# Patient Record
Sex: Male | Born: 1970 | Race: Black or African American | Hispanic: No | Marital: Single | Smoking: Never smoker
Health system: Southern US, Community
[De-identification: ages and names within clinical notes are randomized; demographics above are authoritative.]

## PROBLEM LIST (undated history)

## (undated) DIAGNOSIS — I10 Essential (primary) hypertension: Secondary | ICD-10-CM

## (undated) DIAGNOSIS — F1010 Alcohol abuse, uncomplicated: Secondary | ICD-10-CM

## (undated) DIAGNOSIS — Z9119 Patient's noncompliance with other medical treatment and regimen: Secondary | ICD-10-CM

## (undated) DIAGNOSIS — Z91199 Patient's noncompliance with other medical treatment and regimen due to unspecified reason: Secondary | ICD-10-CM

## (undated) DIAGNOSIS — E119 Type 2 diabetes mellitus without complications: Secondary | ICD-10-CM

## (undated) DIAGNOSIS — K859 Acute pancreatitis without necrosis or infection, unspecified: Secondary | ICD-10-CM

---

## 2004-10-23 ENCOUNTER — Emergency Department (HOSPITAL_COMMUNITY): Admission: EM | Admit: 2004-10-23 | Discharge: 2004-10-23 | Payer: Self-pay | Admitting: Emergency Medicine

## 2004-10-25 ENCOUNTER — Emergency Department (HOSPITAL_COMMUNITY): Admission: EM | Admit: 2004-10-25 | Discharge: 2004-10-25 | Payer: Self-pay | Admitting: Emergency Medicine

## 2005-02-25 ENCOUNTER — Emergency Department (HOSPITAL_COMMUNITY): Admission: EM | Admit: 2005-02-25 | Discharge: 2005-02-25 | Payer: Self-pay | Admitting: Emergency Medicine

## 2005-12-23 DIAGNOSIS — K859 Acute pancreatitis without necrosis or infection, unspecified: Secondary | ICD-10-CM

## 2005-12-23 HISTORY — DX: Acute pancreatitis without necrosis or infection, unspecified: K85.90

## 2007-07-03 ENCOUNTER — Emergency Department (HOSPITAL_COMMUNITY): Admission: EM | Admit: 2007-07-03 | Discharge: 2007-07-03 | Payer: Self-pay | Admitting: Emergency Medicine

## 2007-07-15 ENCOUNTER — Emergency Department (HOSPITAL_COMMUNITY): Admission: EM | Admit: 2007-07-15 | Discharge: 2007-07-15 | Payer: Self-pay | Admitting: Emergency Medicine

## 2007-10-06 ENCOUNTER — Emergency Department (HOSPITAL_COMMUNITY): Admission: EM | Admit: 2007-10-06 | Discharge: 2007-10-06 | Payer: Self-pay | Admitting: Emergency Medicine

## 2008-01-28 ENCOUNTER — Emergency Department (HOSPITAL_COMMUNITY): Admission: EM | Admit: 2008-01-28 | Discharge: 2008-01-28 | Payer: Self-pay | Admitting: Emergency Medicine

## 2008-06-13 ENCOUNTER — Emergency Department (HOSPITAL_COMMUNITY): Admission: EM | Admit: 2008-06-13 | Discharge: 2008-06-13 | Payer: Self-pay | Admitting: Emergency Medicine

## 2008-07-26 ENCOUNTER — Emergency Department (HOSPITAL_COMMUNITY): Admission: EM | Admit: 2008-07-26 | Discharge: 2008-07-26 | Payer: Self-pay | Admitting: Emergency Medicine

## 2008-10-03 ENCOUNTER — Emergency Department (HOSPITAL_COMMUNITY): Admission: EM | Admit: 2008-10-03 | Discharge: 2008-10-03 | Payer: Self-pay | Admitting: *Deleted

## 2011-10-07 LAB — RAPID STREP SCREEN (MED CTR MEBANE ONLY): Streptococcus, Group A Screen (Direct): POSITIVE — AB

## 2011-10-08 LAB — RAPID STREP SCREEN (MED CTR MEBANE ONLY): Streptococcus, Group A Screen (Direct): POSITIVE — AB

## 2013-12-19 ENCOUNTER — Emergency Department (HOSPITAL_COMMUNITY)
Admission: EM | Admit: 2013-12-19 | Discharge: 2013-12-19 | Disposition: A | Discharge status: Home / Self Care | Payer: Managed Care, Other (non HMO) | Attending: Emergency Medicine | Admitting: Emergency Medicine

## 2013-12-19 ENCOUNTER — Encounter (HOSPITAL_COMMUNITY): Payer: Self-pay | Admitting: Emergency Medicine

## 2013-12-19 DIAGNOSIS — B349 Viral infection, unspecified: Secondary | ICD-10-CM

## 2013-12-19 DIAGNOSIS — J069 Acute upper respiratory infection, unspecified: Secondary | ICD-10-CM | POA: Insufficient documentation

## 2013-12-19 DIAGNOSIS — IMO0001 Reserved for inherently not codable concepts without codable children: Secondary | ICD-10-CM | POA: Insufficient documentation

## 2013-12-19 DIAGNOSIS — B9789 Other viral agents as the cause of diseases classified elsewhere: Secondary | ICD-10-CM | POA: Insufficient documentation

## 2013-12-19 MED ORDER — IBUPROFEN 800 MG PO TABS: 800.0000 mg | ORAL_TABLET | Freq: Three times a day (TID) | ORAL | Status: DC

## 2013-12-19 MED ORDER — IBUPROFEN 800 MG PO TABS
800.0000 mg | ORAL_TABLET | Freq: Once | ORAL | Status: AC
  Administered 2013-12-19: 800 mg via ORAL
  Filled 2013-12-19: qty 1

## 2013-12-19 MED ORDER — OSELTAMIVIR PHOSPHATE 75 MG PO CAPS: 75.0000 mg | ORAL_CAPSULE | Freq: Two times a day (BID) | ORAL | Status: DC

## 2013-12-19 NOTE — ED Notes (Signed)
Patient with no complaints at this time. Respirations even and unlabored. Skin warm/dry. Discharge instructions reviewed with patient at this time. Patient given opportunity to voice concerns/ask questions. Patient discharged at this time and left Emergency Department with steady gait.   

## 2013-12-19 NOTE — ED Notes (Signed)
Cough, cold symptoms, and running a fever per pt.

## 2013-12-20 NOTE — ED Provider Notes (Signed)
CSN: [id number]     Arrival date & time 12/19/13  2118 History   First MD Initiated Contact with Patient 12/19/13 2233     Chief Complaint  Patient presents with  . Fever  . URI   (Consider location/radiation/quality/duration/timing/severity/associated sxs/prior Treatment) Patient is a 42 y.o. male presenting with fever and URI. The history is provided by the patient.  Fever Max temp prior to arrival:  102 Temp source:  Subjective Severity:  Moderate Onset quality:  Sudden Duration:  1 day Timing:  Constant Progression:  Worsening Chronicity:  New Relieved by:  Nothing Worsened by:  Nothing tried Ineffective treatments: OTC medications. Associated symptoms: chills, congestion, cough, myalgias, rhinorrhea and sore throat   Associated symptoms: no chest pain, no confusion, no diarrhea, no dysuria, no ear pain, no headaches, no nausea, no rash and no vomiting   Congestion:    Location:  Nasal   Interferes with sleep: no     Interferes with eating/drinking: no   Cough:    Cough characteristics:  Non-productive and productive   Sputum characteristics:  Yellow   Severity:  Mild   Onset quality:  Sudden   Duration:  2 days   Timing:  Intermittent   Progression:  Worsening   Chronicity:  New Myalgias:    Location:  Generalized   Quality:  Aching   Severity:  Moderate   Onset quality:  Sudden   Timing:  Constant Risk factors: sick contacts   URI Presenting symptoms: congestion, cough, fever, rhinorrhea and sore throat   Presenting symptoms: no ear pain   Associated symptoms: myalgias   Associated symptoms: no arthralgias, no headaches, no neck pain and no wheezing     History reviewed. No pertinent past medical history. History reviewed. No pertinent past surgical history. No family history on file. History  Substance Use Topics  . Smoking status: Never Smoker   . Smokeless tobacco: Not on file  . Alcohol Use: Yes    Review of Systems  Constitutional: Positive  for fever and chills. Negative for activity change and appetite change.  HENT: Positive for congestion, rhinorrhea and sore throat. Negative for ear pain, facial swelling and trouble swallowing.   Eyes: Negative for visual disturbance.  Respiratory: Positive for cough. Negative for chest tightness, shortness of breath, wheezing and stridor.   Cardiovascular: Negative for chest pain.  Gastrointestinal: Negative for nausea, vomiting, abdominal pain and diarrhea.  Genitourinary: Negative for dysuria, frequency, flank pain and decreased urine volume.  Musculoskeletal: Positive for myalgias. Negative for arthralgias, neck pain and neck stiffness.  Skin: Negative.  Negative for rash.  Neurological: Negative for dizziness, syncope, weakness, numbness and headaches.  Hematological: Negative for adenopathy.  Psychiatric/Behavioral: Negative for confusion.  All other systems reviewed and are negative.    Allergies  Review of patient's allergies indicates no known allergies.  Home Medications   Current Outpatient Rx  Name  Route  Sig  Dispense  Refill  . Pheniramine-PE-APAP (THERAFLU COLD & SORE THROAT) 20-10-325 MG PACK   Oral   Take 1 packet by mouth as needed (for cold symptoms).         Marland Kitchen ibuprofen (ADVIL,MOTRIN) 800 MG tablet   Oral   Take 1 tablet (800 mg total) by mouth 3 (three) times daily.   21 tablet   0   . oseltamivir (TAMIFLU) 75 MG capsule   Oral   Take 1 capsule (75 mg total) by mouth 2 (two) times daily. For 5 days   10 capsule  0    BP 156/100  Pulse 108  Temp(Src) 99.8 F (37.7 C) (Oral)  Resp 20  Ht 5\' 8"  (1.727 m)  Wt 216 lb (97.977 kg)  BMI 32.85 kg/m2  SpO2 98% Physical Exam  Nursing note and vitals reviewed. Constitutional: He is oriented to person, place, and time. He appears well-developed and well-nourished. No distress.  HENT:  Head: Normocephalic and atraumatic.  Right Ear: Tympanic membrane and ear canal normal.  Left Ear: Tympanic membrane  and ear canal normal.  Nose: Mucosal edema and rhinorrhea present.  Mouth/Throat: Uvula is midline and mucous membranes are normal. No trismus in the jaw. No uvula swelling. Posterior oropharyngeal erythema present. No oropharyngeal exudate, posterior oropharyngeal edema or tonsillar abscesses.  Eyes: Conjunctivae are normal.  Neck: Normal range of motion and phonation normal. Neck supple. No Brudzinski's sign and no Kernig's sign noted.  Cardiovascular: Normal rate, regular rhythm, normal heart sounds and intact distal pulses.   No murmur heard. Pulmonary/Chest: Effort normal and breath sounds normal. No respiratory distress. He has no wheezes. He has no rales.  Abdominal: Soft. He exhibits no distension. There is no tenderness. There is no rebound and no guarding.  Musculoskeletal: Normal range of motion. He exhibits no edema.  Lymphadenopathy:    He has no cervical adenopathy.  Neurological: He is alert and oriented to person, place, and time. He exhibits normal muscle tone. Coordination normal.  Skin: Skin is warm and dry.    ED Course  Procedures (including critical care time) Labs Review Labs Reviewed - No data to display Imaging Review No results found.  EKG Interpretation   None       MDM   1. Viral illness     Patient is well appearing.  VSS.  Sx's likely related to influenza.  Agrees to Tamiflu, ibuprofen and close f/u if not improving.  Pt appears stable for discharge.       Charlissa Petros L. Trisha Mangle, PA-C 12/20/13 0034

## 2013-12-22 NOTE — ED Provider Notes (Signed)
Medical screening examination/treatment/procedure(s) were performed by non-physician practitioner and as supervising physician I was immediately available for consultation/collaboration.  EKG Interpretation   None         Mollye Guinta L Fisher Hargadon, MD 12/22/13 1454 

## 2014-03-07 ENCOUNTER — Encounter (HOSPITAL_COMMUNITY): Payer: Self-pay | Admitting: Emergency Medicine

## 2014-03-07 ENCOUNTER — Emergency Department (HOSPITAL_COMMUNITY)
Admission: EM | Admit: 2014-03-07 | Discharge: 2014-03-07 | Disposition: A | Discharge status: Home / Self Care | Payer: Managed Care, Other (non HMO) | Attending: Emergency Medicine | Admitting: Emergency Medicine

## 2014-03-07 DIAGNOSIS — Y929 Unspecified place or not applicable: Secondary | ICD-10-CM | POA: Insufficient documentation

## 2014-03-07 DIAGNOSIS — M545 Low back pain, unspecified: Secondary | ICD-10-CM

## 2014-03-07 DIAGNOSIS — X500XXA Overexertion from strenuous movement or load, initial encounter: Secondary | ICD-10-CM | POA: Insufficient documentation

## 2014-03-07 DIAGNOSIS — IMO0002 Reserved for concepts with insufficient information to code with codable children: Secondary | ICD-10-CM | POA: Insufficient documentation

## 2014-03-07 DIAGNOSIS — Y9389 Activity, other specified: Secondary | ICD-10-CM | POA: Insufficient documentation

## 2014-03-07 MED ORDER — DIAZEPAM 5 MG PO TABS: 5.0000 mg | ORAL_TABLET | Freq: Every evening | ORAL | Status: DC | PRN

## 2014-03-07 MED ORDER — KETOROLAC TROMETHAMINE 60 MG/2ML IM SOLN
60.0000 mg | Freq: Once | INTRAMUSCULAR | Status: AC
  Administered 2014-03-07: 60 mg via INTRAMUSCULAR
  Filled 2014-03-07: qty 2

## 2014-03-07 MED ORDER — HYDROCODONE-ACETAMINOPHEN 5-325 MG PO TABS: 2.0000 | ORAL_TABLET | Freq: Four times a day (QID) | ORAL | Status: DC | PRN

## 2014-03-07 NOTE — ED Provider Notes (Signed)
Medical screening examination/treatment/procedure(s) were performed by non-physician practitioner and as supervising physician I was immediately available for consultation/collaboration.   EKG Interpretation None       Jony Ladnier, MD 03/07/14 2022 

## 2014-03-07 NOTE — ED Provider Notes (Signed)
CSN: [id number]     Arrival date & time 03/07/14  1503 History   First MD Initiated Contact with Patient 03/07/14 1742     Chief Complaint  Patient presents with  . Back Pain     (Consider location/radiation/quality/duration/timing/severity/associated sxs/prior Treatment) Patient is a 42 y.o. male presenting with back pain. The history is provided by the patient. No language interpreter was used.  Back Pain Location:  Lumbar spine Radiates to:  Does not radiate Pain severity:  Moderate Duration:  1 day Context: lifting heavy objects   Associated symptoms: no dysuria, no fever and no weakness    patient is a 43 year old male who presents here with back pain. He reports that he was moving heavy furniture yesterday and woke up this morning with lumbar back pain. He denies numbness or tingling. He denies incontinence of bowel or bladder. He reports full range of motion to all extremities. He reports that his back is increasingly more sore over the last 24 hours.    History reviewed. No pertinent past medical history. History reviewed. No pertinent past surgical history. History reviewed. No pertinent family history. History  Substance Use Topics  . Smoking status: Never Smoker   . Smokeless tobacco: Not on file  . Alcohol Use: Yes    Review of Systems  Constitutional: Negative for fever, chills and fatigue.  Genitourinary: Negative for dysuria.  Musculoskeletal: Positive for back pain. Negative for gait problem, neck pain and neck stiffness.  Skin: Negative for rash.  Neurological: Negative for weakness.  All other systems reviewed and are negative.      Allergies  Review of patient's allergies indicates no known allergies.  Home Medications   Current Outpatient Rx  Name  Route  Sig  Dispense  Refill  . ibuprofen (ADVIL,MOTRIN) 200 MG tablet   Oral   Take 200 mg by mouth every 6 (six) hours as needed.          BP 186/78  Pulse 78  Temp(Src) 98.1 F (36.7 C)  (Oral)  Resp 16  SpO2 100% Physical Exam  Nursing note and vitals reviewed. Constitutional: He is oriented to person, place, and time. He appears well-developed and well-nourished. No distress.  Well-appearing  HENT:  Head: Normocephalic and atraumatic.  Eyes: Conjunctivae and EOM are normal.  Neck: Normal range of motion. Neck supple. No JVD present. No tracheal deviation present. No thyromegaly present.  Cardiovascular: Normal rate, regular rhythm, normal heart sounds and intact distal pulses.   Pulmonary/Chest: Effort normal and breath sounds normal. No respiratory distress. He has no wheezes.  Musculoskeletal: Normal range of motion.  Right and left paravertebral tenderness in the lumbar region. No midline spinal tenderness, deformities or step offs. No numbness or tingling. No gait disturbances. Full range of motion x 4 extremities and back.   Lymphadenopathy:    He has no cervical adenopathy.  Neurological: He is alert and oriented to person, place, and time. No cranial nerve deficit. Coordination normal.  Skin: Skin is warm and dry. No rash noted.     Psychiatric: He has a normal mood and affect. His behavior is normal. Judgment and thought content normal.    ED Course  Procedures (including critical care time) Labs Review Labs Reviewed - No data to display Imaging Review No results found.   EKG Interpretation None      MDM   Final diagnoses:  Lumbar pain    Probable lumbosacral strain after moving heavy furniture yesterday. Patient awakened today with muscle  tension and pain in his lumbar region. No midline spinal tenderness, deformities or step offs. No focal weakness or gait deficits. No numbness or tingling. Good range of motion, strength and coordination. Patient reports some relief here with Toradol injection. Prescriptions for Valium and hydrocodone given. Discussed plan of care and patient agrees.       Irish EldersKelly Nadia Torr, NP 03/07/14 1859

## 2014-03-07 NOTE — ED Notes (Signed)
Pt reports that he developed lower back pain as a result of moving furniture yesterday.

## 2014-03-07 NOTE — ED Notes (Signed)
Low back pain after moving furniture yesterday

## 2014-03-07 NOTE — Discharge Instructions (Signed)
Back Exercises Back exercises help treat and prevent back injuries. The goal of back exercises is to increase the strength of your abdominal and back muscles and the flexibility of your back. These exercises should be started when you no longer have back pain. Back exercises include:  Pelvic Tilt. Lie on your back with your knees bent. Tilt your pelvis until the lower part of your back is against the floor. Hold this position 5 to 10 sec and repeat 5 to 10 times.  Knee to Chest. Pull first 1 knee up against your chest and hold for 20 to 30 seconds, repeat this with the other knee, and then both knees. This may be done with the other leg straight or bent, whichever feels better.  Sit-Ups or Curl-Ups. Bend your knees 90 degrees. Start with tilting your pelvis, and do a partial, slow sit-up, lifting your trunk only 30 to 45 degrees off the floor. Take at least 2 to 3 seconds for each sit-up. Do not do sit-ups with your knees out straight. If partial sit-ups are difficult, simply do the above but with only tightening your abdominal muscles and holding it as directed.  Hip-Lift. Lie on your back with your knees flexed 90 degrees. Push down with your feet and shoulders as you raise your hips a couple inches off the floor; hold for 10 seconds, repeat 5 to 10 times.  Back arches. Lie on your stomach, propping yourself up on bent elbows. Slowly press on your hands, causing an arch in your low back. Repeat 3 to 5 times. Any initial stiffness and discomfort should lessen with repetition over time.  Shoulder-Lifts. Lie face down with arms beside your body. Keep hips and torso pressed to floor as you slowly lift your head and shoulders off the floor. Do not overdo your exercises, especially in the beginning. Exercises may cause you some mild back discomfort which lasts for a few minutes; however, if the pain is more severe, or lasts for more than 15 minutes, do not continue exercises until you see your caregiver.  Improvement with exercise therapy for back problems is slow.  See your caregivers for assistance with developing a proper back exercise program. Document Released: 01/16/2005 Document Revised: 03/02/2012 Document Reviewed: 10/10/2011 [facility] Patient Information 2014 [place]. Lumbosacral Strain Lumbosacral strain is a strain of any of the parts that make up your lumbosacral vertebrae. Your lumbosacral vertebrae are the bones that make up the lower third of your backbone. Your lumbosacral vertebrae are held together by muscles and tough, fibrous tissue (ligaments).  CAUSES  A sudden blow to your back can cause lumbosacral strain. Also, anything that causes an excessive stretch of the muscles in the low back can cause this strain. This is typically seen when people exert themselves strenuously, fall, lift heavy objects, bend, or crouch repeatedly. RISK FACTORS  Physically demanding work.  Participation in pushing or pulling sports or sports that require sudden twist of the back (tennis, golf, baseball).  Weight lifting.  Excessive lower back curvature.  Forward-tilted pelvis.  Weak back or abdominal muscles or both.  Tight hamstrings. SIGNS AND SYMPTOMS  Lumbosacral strain may cause pain in the area of your injury or pain that moves (radiates) down your leg.  DIAGNOSIS Your health care provider can often diagnose lumbosacral strain through a physical exam. In some cases, you may need tests such as X-ray exams.  TREATMENT  Treatment for your lower back injury depends on many factors that your clinician will have to  evaluate. However, most treatment will include the use of anti-inflammatory medicines. HOME CARE INSTRUCTIONS   Avoid hard physical activities (tennis, racquetball, waterskiing) if you are not in proper physical condition for it. This may aggravate or create problems.  If you have a back problem, avoid sports requiring sudden body movements. Swimming and walking are  generally safer activities.  Maintain good posture.  Maintain a healthy weight.  For acute conditions, you may put ice on the injured area.  Put ice in a plastic bag.  Place a towel between your skin and the bag.  Leave the ice on for 20 minutes, 2 3 times a day.  When the low back starts healing, stretching and strengthening exercises may be recommended. SEEK MEDICAL CARE IF:  Your back pain is getting worse.  You experience severe back pain not relieved with medicines. SEEK IMMEDIATE MEDICAL CARE IF:   You have numbness, tingling, weakness, or problems with the use of your arms or legs.  There is a change in bowel or bladder control.  You have increasing pain in any area of the body, including your belly (abdomen).  You notice shortness of breath, dizziness, or feel faint.  You feel sick to your stomach (nauseous), are throwing up (vomiting), or become sweaty.  You notice discoloration of your toes or legs, or your feet get very cold. MAKE SURE YOU:   Understand these instructions.  Will watch your condition.  Will get help right away if you are not doing well or get worse. Document Released: 09/18/2005 Document Revised: 09/29/2013 Document Reviewed: 07/28/2013 [facility] Patient Information 2014 [place], [place].   Take a Valium at nighttime for muscle spasm Take hydrocodone for moderate to severe pain as prescribed Take ibuprofen during the daytime Use ice packs or heat for comfort

## 2015-06-26 ENCOUNTER — Encounter (HOSPITAL_COMMUNITY): Payer: Self-pay | Admitting: Emergency Medicine

## 2015-06-26 ENCOUNTER — Emergency Department (HOSPITAL_COMMUNITY)
Admission: EM | Admit: 2015-06-26 | Discharge: 2015-06-26 | Disposition: A | Discharge status: Home / Self Care | Payer: Managed Care, Other (non HMO) | Attending: Emergency Medicine | Admitting: Emergency Medicine

## 2015-06-26 DIAGNOSIS — Z79899 Other long term (current) drug therapy: Secondary | ICD-10-CM | POA: Diagnosis not present

## 2015-06-26 DIAGNOSIS — R112 Nausea with vomiting, unspecified: Secondary | ICD-10-CM | POA: Insufficient documentation

## 2015-06-26 DIAGNOSIS — R109 Unspecified abdominal pain: Secondary | ICD-10-CM | POA: Insufficient documentation

## 2015-06-26 LAB — COMPREHENSIVE METABOLIC PANEL
ALT: 35 U/L (ref 17–63)
AST: 32 U/L (ref 15–41)
Albumin: 4.4 g/dL (ref 3.5–5.0)
Alkaline Phosphatase: 48 U/L (ref 38–126)
Anion gap: 11 (ref 5–15)
BILIRUBIN TOTAL: 1.2 mg/dL (ref 0.3–1.2)
BUN: 12 mg/dL (ref 6–20)
CALCIUM: 8.8 mg/dL — AB (ref 8.9–10.3)
CO2: 27 mmol/L (ref 22–32)
CREATININE: 0.96 mg/dL (ref 0.61–1.24)
Chloride: 98 mmol/L — ABNORMAL LOW (ref 101–111)
GFR calc non Af Amer: >60 mL/min (ref >60)
Glucose, Bld: 125 mg/dL — ABNORMAL HIGH (ref 65–99)
POTASSIUM: 3.7 mmol/L (ref 3.5–5.1)
Sodium: 136 mmol/L (ref 135–145)
Total Protein: 7.4 g/dL (ref 6.5–8.1)

## 2015-06-26 LAB — URINE MICROSCOPIC-ADD ON

## 2015-06-26 LAB — URINALYSIS, ROUTINE W REFLEX MICROSCOPIC
BILIRUBIN URINE: NEGATIVE
Glucose, UA: NEGATIVE mg/dL
Ketones, ur: NEGATIVE mg/dL
Leukocytes, UA: NEGATIVE
NITRITE: NEGATIVE
PH: 6 (ref 5.0–8.0)
Protein, ur: NEGATIVE mg/dL
Specific Gravity, Urine: 1.015 (ref 1.005–1.030)
Urobilinogen, UA: 0.2 mg/dL (ref 0.0–1.0)

## 2015-06-26 LAB — CBC WITH DIFFERENTIAL/PLATELET
BASOS ABS: 0 10*3/uL (ref 0.0–0.1)
BASOS PCT: 0 % (ref 0–1)
EOS ABS: 0 10*3/uL (ref 0.0–0.7)
Eosinophils Relative: 0 % (ref 0–5)
HEMATOCRIT: 42.7 % (ref 39.0–52.0)
HEMOGLOBIN: 15 g/dL (ref 13.0–17.0)
LYMPHS ABS: 1.2 10*3/uL (ref 0.7–4.0)
LYMPHS PCT: 14 % (ref 12–46)
MCH: 31.9 pg (ref 26.0–34.0)
MCHC: 35.1 g/dL (ref 30.0–36.0)
MCV: 90.9 fL (ref 78.0–100.0)
MONO ABS: 0.5 10*3/uL (ref 0.1–1.0)
Monocytes Relative: 6 % (ref 3–12)
NEUTROS ABS: 7.1 10*3/uL (ref 1.7–7.7)
Neutrophils Relative %: 80 % — ABNORMAL HIGH (ref 43–77)
PLATELETS: 232 10*3/uL (ref 150–400)
RBC: 4.7 MIL/uL (ref 4.22–5.81)
RDW: 12.6 % (ref 11.5–15.5)
WBC: 8.9 10*3/uL (ref 4.0–10.5)

## 2015-06-26 LAB — LIPASE, BLOOD: LIPASE: 37 U/L (ref 22–51)

## 2015-06-26 MED ORDER — ONDANSETRON 8 MG PO TBDP
8.0000 mg | ORAL_TABLET | Freq: Once | ORAL | Status: AC
  Administered 2015-06-26: 8 mg via ORAL
  Filled 2015-06-26: qty 1

## 2015-06-26 MED ORDER — ONDANSETRON HCL 4 MG PO TABS: 4.0000 mg | ORAL_TABLET | Freq: Three times a day (TID) | ORAL | Status: DC | PRN

## 2015-06-26 MED ORDER — DICYCLOMINE HCL 10 MG/ML IM SOLN
20.0000 mg | Freq: Once | INTRAMUSCULAR | Status: AC
  Administered 2015-06-26: 20 mg via INTRAMUSCULAR
  Filled 2015-06-26: qty 2

## 2015-06-26 MED ORDER — DICYCLOMINE HCL 20 MG PO TABS: 20.0000 mg | ORAL_TABLET | Freq: Four times a day (QID) | ORAL | Status: DC | PRN

## 2015-06-26 NOTE — ED Notes (Signed)
Pt reports emesis yesterday with upper abd pain. Pt states he had some emesis earlier today, still has abd pain.

## 2015-06-26 NOTE — ED Notes (Signed)
Patient with no complaints at this time. Respirations even and unlabored. Skin warm/dry. Discharge instructions reviewed with patient at this time. Patient given opportunity to voice concerns/ask questions. Patient discharged at this time and left Emergency Department with steady gait.   

## 2015-06-26 NOTE — Discharge Instructions (Signed)
°Emergency Department Resource Guide °1) Find a Doctor and Pay Out of Pocket °Although you won't have to find out who is covered by your insurance plan, it is a good idea to ask around and get recommendations. You will then need to call the office and see if the doctor you have chosen will accept you as a new patient and what types of options they offer for patients who are self-pay. Some doctors offer discounts or will set up payment plans for their patients who do not have insurance, but you will need to ask so you aren't surprised when you get to your appointment. ° °2) Contact Your Local Health Department °Not all health departments have doctors that can see patients for sick visits, but many do, so it is worth a call to see if yours does. If you don't know where your local health department is, you can check in your phone book. The CDC also has a tool to help you locate your state's health department, and many state websites also have listings of all of their local health departments. ° °3) Find a Walk-in Clinic °If your illness is not likely to be very severe or complicated, you may want to try a walk in clinic. These are popping up all over the country in pharmacies, drugstores, and shopping centers. They're usually staffed by nurse practitioners or physician assistants that have been trained to treat common illnesses and complaints. They're usually fairly quick and inexpensive. However, if you have serious medical issues or chronic medical problems, these are probably not your best option. ° °No Primary Care Doctor: °- Call [facility] at  832-8000 - they can help you locate a primary care doctor that  accepts your insurance, provides certain services, etc. °- Physician Referral Service- 1-800-533-3463 ° °Chronic Pain Problems: °Organization         Address  Phone   Notes  °[facility]  (336) [phone] Patients need to be referred by their primary care doctor.  ° °Medication  Assistance: °Organization         Address  Phone   Notes  °[place] [facility] [address] °[place], [place] [address] (336) 641-8030 --Must be a resident of [place] °-- Must have NO insurance coverage whatsoever (no Medicaid/ Medicare, etc.) °-- The pt. MUST have a primary care doctor that directs their care regularly and follows them in the community °  °[facility]  (866) 331-1348   °[employer]  (888) 892-1162   ° °Agencies that provide inexpensive medical care: °Organization         Address  Phone   Notes  °[facility]  (336) 832-8035   °[facility]    (336) 832-7272   °[facility] [address] °[place], [place] [address] (336) 832-4777   °[facility] [address], °[place] (336) 271-4999   °[facility]    (336) 373-0678   °[facility]    (336) 272-1050   °[facility] ° [address], [place] Phone:  (336) 832-4444, Fax:  (336) 832-4440 Hours of Operation:  9 am - 6 pm, M-F.  Also accepts Medicaid/Medicare and self-pay.  °[facility] ° [address], [place] Phone: (336) [phone], Fax: (336) [phone]. Hours of Operation:  8:30 am - 5:30 pm, M-F.  Also accepts Medicaid and self-pay.  °[facility] [place] [address], [place] Phone: (336) 878-6027   °[facility] [address], [place], [place] (336)723-1848, Ext. 123 Mondays & Thursdays: 7-9 AM.  First 15 patients are seen on a first come, first serve basis. °  ° °Medicaid-accepting [place] Providers: ° °Organization         Address  Phone   Notes  °[facility] [address], [place] (336) 641-2100 Also accepts self-pay patients.  °[facility] [address], [place] ° (336) 856-9996   °[facility] [address], [place]  (336) 288-8857   °[facility] [address] [place] [address], [place] (336) 299-7000   °Veita Bland [address], [place]  ° (336) 373-1557 Only accepts [place] Access Medicaid patients after they have their name applied to their card.  ° °Self-Pay (no insurance) in [place]: ° °Organization         Address  Phone   Notes  °Sickle Cell Patients, [facility] [address], [place] (336) 832-1970   °[facility] [address], [place] (336) 832-4400   °[facility] ° [address], [place] (336) 992-4800   °[facility]/Dr. Osei-Bonsu ° [address] [place] [address], [place] or [address], [place] (336) [phone] Phone number for both [place] and [place] locations is the same.  °[facility] [address], [place] (336) 299-0000   °[facility] [address] [place] [address], [place] or [address] (336) 852-7530 °(336) 878-2260   °[facility] [address], [place] (336) 350-1642, phone; (336) 294-5005, fax Sees patients 1st and 3rd Saturday of every month.  Must not qualify for public or private insurance (i.e. Medicaid, Medicare, [place] Health Choice, Veterans' Benefits) • Household income should be no more than 200% of the poverty level •The clinic cannot treat you if you are pregnant or think you are pregnant • Sexually transmitted diseases are not treated at the clinic.  ° ° °Dental Care: °Organization         Address  Phone  Notes  °[place] [facility] [address], [place] (336) 641-6152 Accepts children up to age 21 who are enrolled in Medicaid or [place] Health Choice; pregnant women with a Medicaid card; and children who have applied for Medicaid or [place] Health Choice, but were declined, whose parents can pay a reduced fee at time of service.  °[place]  [facility] [place]  [address], [place] (336) 641-7733 Accepts children up to age 21 who are enrolled in Medicaid or [place] Health Choice; pregnant women with a Medicaid card; and children who have applied for Medicaid or [place] Health Choice, but were declined, whose parents can pay a reduced fee at time of service.  °[facility] ° [address], [place] (336) 641-4533 Patients are seen by appointment only. Walk-ins are not accepted. [facility] will see patients 18 years of age and older. °Monday - Tuesday (8am-5pm) °Most Wednesdays (8:30-5pm) °$30 per visit, cash only  °[facility] ° [address], [place] (336) 641-4533 Patients are seen by appointment only. Walk-ins are not accepted. [facility] will see patients 18 years of age and older. °One   Wednesday Evening (Monthly: Volunteer Based).  $30 per visit, cash only  °[facility]  (919) 537-3737 for adults; Children under age 4, call Graduate Pediatric Dentistry at ([phone]) [phone]. Children aged 4-14, please call ([phone]) [phone] to request a pediatric application. ° Dental services are provided in all areas of dental care including fillings, crowns and bridges, complete and partial dentures, implants, gum treatment, root canals, and extractions. Preventive care is also provided. Treatment is provided to both adults and children. °Patients are selected via a lottery and there is often a waiting list. °  °[facility] [address], °[place] ° (336) 763-8833 [email or web address] °  °[facility] [address], [place], [place] (336)723-1848, Ext. 123 Second and Fourth Thursday of each month, opens at 6:30 AM; Clinic ends at 9 AM.  Patients are seen on a first-come first-served basis, and a limited number are seen during each clinic.  ° °[facility] ° [address], [place], [place] (336) 723-7904    Eligibility Requirements °You must have lived in [place], [place], or [place] counties for at least the last three months. °  You cannot be eligible for state or federal sponsored healthcare insurance, including Veterans Administration, Medicaid, or Medicare. °  You generally cannot be eligible for healthcare insurance through your employer.  °  How to apply: °Eligibility screenings are held every Tuesday and Wednesday afternoon from 1:00 pm until 4:00 pm. You do not need an appointment for the interview!  °[facility] [address], [place], [place] 336-631-2330   °[facility]  336-342-8273   °[facility]  336-703-3100   °[facility]  336-570-6415   ° °Behavioral Health Resources in the Community: °Intensive Outpatient Programs °Organization         Address  Phone  Notes  °[facility] [address], [place], [place] 336-878-6098   °[facility] [address], [place], [place] 336-832-9800   °[facility] [address], [place], [place] ° 336-882-2125   °[facility] [address],  °[place], [place] 1-800-853-5163 or 336-641-4981   °Substance Abuse Resources °Organization         Address  Phone  Notes  °[facility]  336-882-2125   °[facility]  336-784-9470   °[facility]  336-285-9073   °[facility]  336-845-3988   °Residential & Outpatient Substance Abuse Program  1-800-659-3381   °Psychological Services °Organization         Address  Phone  Notes  °[facility]  336- 832-9600   °[facility]  336- 378-7881   °[facility] [address], [place] 1-800-853-5163 or 336-641-4981   ° °Mobile Crisis Teams °Organization         Address  Phone  Notes  °[facility], Mobile Crisis Care Unit  1-877-626-1772   °Assertive °[facility] ° [address]  [place], [place] 336-834-9664   °Sharon DeEsch [address] °[place] 336-554-5454   ° °Self-Help/Support Groups °Organization         Address  Phone             Notes  °[facility] of  - variety of support groups  336- 373-1402 Call for more information  °Narcotics Anonymous (NA), [facility] [address], °[place]  2 meetings at this location  ° °  Residential Treatment Programs Organization         Address  Phone  Notes  [facility] [address],    [place]  [phone]   [facility]  [address], [place] [id number], [place], [place] [phone]   [facility] [address], [place] [phone] Admissions: 8am-3pm M-F  [facility] [address]    [place], [place] [phone]   [facility] [address], [place], [place] [phone]   [facility] [address]  [place], [place] [phone]   [facility] Intensive Outpatient [address], [place], [place] [phone]   [facility] ([facility] [address]  [place], [place] [phone] or [phone])[phone]   [facility] ([facility] [address] [place] [address] [place], [place] [phone] Accepts Medicaid  [facility] [address]  [place] [phone] Substance Abuse/Addiction Treatment   [facility]         Address  Phone  Notes  [facility]  (618) 423-5220(888) [phone]   Angie FavaJulie Brannon, PhD [address] [place], [place]   [phone](336) [phone] or [phone](336) [phone]   [facility]   [address] [place], [place] [phone](336) [phone]   [facility] [address], [place], [place] [phone](336) [phone] Insurance/Medicaid/sponsorship through [facility]  [facility] [address]                                    [place], [place] ([phone](336) [phone] Therapy/tele-psych/case    [facility] [address]   [place], [place] [phone](336) [phone]    Dr. Lolly MustacheArfeen  [phone]) [phone]   [facility]  [employer] [facility] 1) [address], [place] 2) [address], [place] 3)  [address], [place] [phone](336) [phone](336) [phone]  [phone](336) [phone]   [facility] (609)780-9599(336) (865)640-9529 or 626-325-2769(336) (540)[phone] (After Hours)      Take the prescriptions as directed.  Increase your fluid intake (ie:  Gatoraide) for the next few days, as discussed.  Eat a bland diet and advance to your regular diet slowly as you can tolerate it.  Call your regular medical doctor tomorrow to schedule a follow up appointment this week.  Return to the Emergency Department immediately if not improving (or even worsening) despite taking the medicines as prescribed, any black or bloody stool or vomit, if you develop a fever over "101," or for any other concerns.

## 2015-06-26 NOTE — ED Notes (Signed)
Gave pt a ginger ale as a fluid challenge

## 2015-06-26 NOTE — ED Provider Notes (Signed)
CSN: [id number]     Arrival date & time 06/26/15  1318 History   First MD Initiated Contact with Patient 06/26/15 1507     Chief Complaint  Patient presents with  . Abdominal Pain      HPI  Pt was seen at 1510.  Per pt, c/o gradual onset and slow improvement of constant upper abd "pain" since yesterday.  Has been associated with multiple intermittent episodes of N/V.  Describes the abd pain as "cramping." States he feels "a little better" today.  Denies diarrhea, no fevers, no back pain, no rash, no CP/SOB, no black or blood in stools or emesis.      History reviewed. No pertinent past medical history.   History reviewed. No pertinent past surgical history.  History  Substance Use Topics  . Smoking status: Never Smoker   . Smokeless tobacco: Never Used  . Alcohol Use: Yes     Comment: soc    Review of Systems ROS: Statement: All systems negative except as marked or noted in the HPI; Constitutional: Negative for fever and chills. ; ; Eyes: Negative for eye pain, redness and discharge. ; ; ENMT: Negative for ear pain, hoarseness, nasal congestion, sinus pressure and sore throat. ; ; Cardiovascular: Negative for chest pain, palpitations, diaphoresis, dyspnea and peripheral edema. ; ; Respiratory: Negative for cough, wheezing and stridor. ; ; Gastrointestinal: +N/V, abd pain. Negative for diarrhea, blood in stool, hematemesis, jaundice and rectal bleeding. . ; ; Genitourinary: Negative for dysuria, flank pain and hematuria. ; ; Musculoskeletal: Negative for back pain and neck pain. Negative for swelling and trauma.; ; Skin: Negative for pruritus, rash, abrasions, blisters, bruising and skin lesion.; ; Neuro: Negative for headache, lightheadedness and neck stiffness. Negative for weakness, altered level of consciousness , altered mental status, extremity weakness, paresthesias, involuntary movement, seizure and syncope.      Allergies  Review of patient's allergies indicates no known  allergies.  Home Medications   Prior to Admission medications   Medication Sig Start Date End Date Taking? Authorizing Provider  Multiple Vitamin (MULTIVITAMIN WITH MINERALS) TABS tablet Take 1 tablet by mouth daily.   Yes Historical Provider, MD  diazepam (VALIUM) 5 MG tablet Take 1 tablet (5 mg total) by mouth at bedtime as needed for muscle spasms. Patient not taking: Reported on 06/26/2015 03/07/14   Irish Elders, NP  HYDROcodone-acetaminophen (NORCO/VICODIN) 5-325 MG per tablet Take 2 tablets by mouth every 6 (six) hours as needed. Patient not taking: Reported on 06/26/2015 03/07/14   Irish Elders, NP   BP 175/87 mmHg  Pulse 63  Temp(Src) 98.6 F (37 C) (Oral)  Resp 16  Ht  (1.727 m)  Wt 195 lb (88.451 kg)  BMI 29.66 kg/m2  SpO2 100% Physical Exam  1515: Physical examination:  Nursing notes reviewed; Vital signs and O2 SAT reviewed;  Constitutional: Well developed, Well nourished, Well hydrated, In no acute distress; Head:  Normocephalic, atraumatic; Eyes: EOMI, PERRL, No scleral icterus; ENMT: Mouth and pharynx normal, Mucous membranes moist; Neck: Supple, Full range of motion, No lymphadenopathy; Cardiovascular: Regular rate and rhythm, No murmur, rub, or gallop; Respiratory: Breath sounds clear & equal bilaterally, No rales, rhonchi, wheezes.  Speaking full sentences with ease, Normal respiratory effort/excursion; Chest: Nontender, Movement normal; Abdomen: Soft, Nontender, Nondistended, Normal bowel sounds; Genitourinary: No CVA tenderness; Extremities: Pulses normal, No tenderness, No edema, No calf edema or asymmetry.; Neuro: AA&Ox3, Major CN grossly intact.  Speech clear. No gross focal motor or sensory deficits in  extremities.; Skin: Color normal, Warm, Dry.   ED Course  Procedures    EKG Interpretation None      MDM  MDM Reviewed: previous chart, nursing note and vitals Reviewed previous: labs Interpretation: labs      Results for orders placed or performed  during the hospital encounter of 06/26/15  CBC with Differential  Result Value Ref Range   WBC 8.9 4.0 - 10.5 K/uL   RBC 4.70 4.22 - 5.81 MIL/uL   Hemoglobin 15.0 13.0 - 17.0 g/dL   HCT 16.142.7 09.639.0 - 04.552.0 %   MCV 90.9 78.0 - 100.0 fL   MCH 31.9 26.0 - 34.0 pg   MCHC 35.1 30.0 - 36.0 g/dL   RDW 40.912.6 81.111.5 - 91.415.5 %   Platelets 232 150 - 400 K/uL   Neutrophils Relative % 80 (H) 43 - 77 %   Neutro Abs 7.1 1.7 - 7.7 K/uL   Lymphocytes Relative 14 12 - 46 %   Lymphs Abs 1.2 0.7 - 4.0 K/uL   Monocytes Relative 6 3 - 12 %   Monocytes Absolute 0.5 0.1 - 1.0 K/uL   Eosinophils Relative 0 0 - 5 %   Eosinophils Absolute 0.0 0.0 - 0.7 K/uL   Basophils Relative 0 0 - 1 %   Basophils Absolute 0.0 0.0 - 0.1 K/uL  Comprehensive metabolic panel  Result Value Ref Range   Sodium 136 135 - 145 mmol/L   Potassium 3.7 3.5 - 5.1 mmol/L   Chloride 98 (L) 101 - 111 mmol/L   CO2 27 22 - 32 mmol/L   Glucose, Bld 125 (H) 65 - 99 mg/dL   BUN 12 6 - 20 mg/dL   Creatinine, Ser 7.820.96 0.61 - 1.24 mg/dL   Calcium 8.8 (L) 8.9 - 10.3 mg/dL   Total Protein 7.4 6.5 - 8.1 g/dL   Albumin 4.4 3.5 - 5.0 g/dL   AST 32 15 - 41 U/L   ALT 35 17 - 63 U/L   Alkaline Phosphatase 48 38 - 126 U/L   Total Bilirubin 1.2 0.3 - 1.2 mg/dL   GFR calc non Af Amer >60 >60 mL/min   GFR calc Af Amer >60 >60 mL/min   Anion gap 11 5 - 15  Lipase, blood  Result Value Ref Range   Lipase 37 22 - 51 U/L  Urinalysis, Routine w reflex microscopic (not at [facility])  Result Value Ref Range   Color, Urine YELLOW YELLOW   APPearance CLEAR CLEAR   Specific Gravity, Urine 1.015 1.005 - 1.030   pH 6.0 5.0 - 8.0   Glucose, UA NEGATIVE NEGATIVE mg/dL   Hgb urine dipstick TRACE (A) NEGATIVE   Bilirubin Urine NEGATIVE NEGATIVE   Ketones, ur NEGATIVE NEGATIVE mg/dL   Protein, ur NEGATIVE NEGATIVE mg/dL   Urobilinogen, UA 0.2 0.0 - 1.0 mg/dL   Nitrite NEGATIVE NEGATIVE   Leukocytes, UA NEGATIVE NEGATIVE  Urine microscopic-add on  Result Value Ref  Range   Squamous Epithelial / LPF RARE RARE   WBC, UA 0-2 <3 WBC/hpf   RBC / HPF 0-2 <3 RBC/hpf   Bacteria, UA RARE RARE   Casts HYALINE CASTS (A) NEGATIVE     1650:  Pt has tol PO well while in the ED without N/V.  No stooling while in the ED.  Abd remains benign, VSS. Feels better and wants to go home now. Dx and testing d/w pt.  Questions answered.  Verb understanding, agreeable to d/c home with outpt f/u.  Samuel Jester, DO 06/28/15 1152

## 2017-12-25 ENCOUNTER — Encounter (HOSPITAL_COMMUNITY): Payer: Self-pay | Admitting: *Deleted

## 2017-12-25 DIAGNOSIS — I10 Essential (primary) hypertension: Secondary | ICD-10-CM | POA: Insufficient documentation

## 2017-12-25 DIAGNOSIS — R197 Diarrhea, unspecified: Secondary | ICD-10-CM

## 2017-12-25 DIAGNOSIS — R7309 Other abnormal glucose: Secondary | ICD-10-CM | POA: Insufficient documentation

## 2017-12-25 DIAGNOSIS — E781 Pure hyperglyceridemia: Secondary | ICD-10-CM | POA: Diagnosis not present

## 2017-12-25 DIAGNOSIS — F1020 Alcohol dependence, uncomplicated: Secondary | ICD-10-CM | POA: Diagnosis not present

## 2017-12-25 DIAGNOSIS — E876 Hypokalemia: Secondary | ICD-10-CM | POA: Insufficient documentation

## 2017-12-25 DIAGNOSIS — R112 Nausea with vomiting, unspecified: Secondary | ICD-10-CM | POA: Insufficient documentation

## 2017-12-25 DIAGNOSIS — R1084 Generalized abdominal pain: Secondary | ICD-10-CM | POA: Diagnosis present

## 2017-12-25 DIAGNOSIS — Z79899 Other long term (current) drug therapy: Secondary | ICD-10-CM | POA: Insufficient documentation

## 2017-12-25 DIAGNOSIS — R1013 Epigastric pain: Secondary | ICD-10-CM

## 2017-12-25 DIAGNOSIS — K859 Acute pancreatitis without necrosis or infection, unspecified: Principal | ICD-10-CM | POA: Insufficient documentation

## 2017-12-25 LAB — URINALYSIS, ROUTINE W REFLEX MICROSCOPIC
Bacteria, UA: NONE SEEN
Bilirubin Urine: NEGATIVE
Glucose, UA: NEGATIVE mg/dL
HGB URINE DIPSTICK: NEGATIVE
KETONES UR: NEGATIVE mg/dL
Leukocytes, UA: NEGATIVE
NITRITE: NEGATIVE
PH: 5 (ref 5.0–8.0)
Protein, ur: 30 mg/dL — AB
SPECIFIC GRAVITY, URINE: 1.024 (ref 1.005–1.030)

## 2017-12-25 LAB — COMPREHENSIVE METABOLIC PANEL
ALK PHOS: 47 U/L (ref 38–126)
ALT: 40 U/L (ref 17–63)
AST: 64 U/L — ABNORMAL HIGH (ref 15–41)
Albumin: 4.7 g/dL (ref 3.5–5.0)
Anion gap: 16 — ABNORMAL HIGH (ref 5–15)
BILIRUBIN TOTAL: 1.3 mg/dL — AB (ref 0.3–1.2)
BUN: 9 mg/dL (ref 6–20)
CHLORIDE: 99 mmol/L — AB (ref 101–111)
CO2: 23 mmol/L (ref 22–32)
Calcium: 9.1 mg/dL (ref 8.9–10.3)
Creatinine, Ser: 0.95 mg/dL (ref 0.61–1.24)
GFR calc non Af Amer: >60 mL/min (ref >60)
Glucose, Bld: 126 mg/dL — ABNORMAL HIGH (ref 65–99)
POTASSIUM: 3.3 mmol/L — AB (ref 3.5–5.1)
SODIUM: 138 mmol/L (ref 135–145)
TOTAL PROTEIN: 8 g/dL (ref 6.5–8.1)

## 2017-12-25 LAB — CBC
HEMATOCRIT: 39.7 % (ref 39.0–52.0)
Hemoglobin: 13.7 g/dL (ref 13.0–17.0)
MCH: 32.9 pg (ref 26.0–34.0)
MCHC: 34.5 g/dL (ref 30.0–36.0)
MCV: 95.4 fL (ref 78.0–100.0)
PLATELETS: 254 10*3/uL (ref 150–400)
RBC: 4.16 MIL/uL — AB (ref 4.22–5.81)
RDW: 13.9 % (ref 11.5–15.5)
WBC: 8.6 10*3/uL (ref 4.0–10.5)

## 2017-12-25 LAB — LIPASE, BLOOD: Lipase: 135 U/L — ABNORMAL HIGH (ref 11–51)

## 2017-12-25 NOTE — ED Triage Notes (Signed)
Pt reports diarrhea x 3 days, vomiting since yesterday, and abdominal cramping. Denies current fevers.

## 2017-12-26 ENCOUNTER — Emergency Department (HOSPITAL_COMMUNITY): Payer: Managed Care, Other (non HMO)

## 2017-12-26 ENCOUNTER — Encounter (HOSPITAL_COMMUNITY): Payer: Self-pay | Admitting: Emergency Medicine

## 2017-12-26 ENCOUNTER — Other Ambulatory Visit: Payer: Self-pay

## 2017-12-26 ENCOUNTER — Observation Stay (HOSPITAL_COMMUNITY)
Admission: EM | Admit: 2017-12-26 | Discharge: 2017-12-29 | Disposition: A | Discharge status: Home / Self Care | Payer: Managed Care, Other (non HMO) | Attending: Internal Medicine | Admitting: Internal Medicine

## 2017-12-26 ENCOUNTER — Emergency Department (HOSPITAL_COMMUNITY)
Admission: EM | Admit: 2017-12-26 | Discharge: 2017-12-26 | Disposition: A | Discharge status: Home / Self Care | Payer: Managed Care, Other (non HMO) | Source: Home / Self Care | Attending: Emergency Medicine | Admitting: Emergency Medicine

## 2017-12-26 DIAGNOSIS — K852 Alcohol induced acute pancreatitis without necrosis or infection: Secondary | ICD-10-CM

## 2017-12-26 DIAGNOSIS — E876 Hypokalemia: Secondary | ICD-10-CM

## 2017-12-26 DIAGNOSIS — R7302 Impaired glucose tolerance (oral): Secondary | ICD-10-CM

## 2017-12-26 DIAGNOSIS — F1020 Alcohol dependence, uncomplicated: Secondary | ICD-10-CM

## 2017-12-26 DIAGNOSIS — R1013 Epigastric pain: Secondary | ICD-10-CM

## 2017-12-26 DIAGNOSIS — R739 Hyperglycemia, unspecified: Secondary | ICD-10-CM

## 2017-12-26 DIAGNOSIS — Z8719 Personal history of other diseases of the digestive system: Secondary | ICD-10-CM

## 2017-12-26 DIAGNOSIS — E1169 Type 2 diabetes mellitus with other specified complication: Secondary | ICD-10-CM

## 2017-12-26 DIAGNOSIS — R112 Nausea with vomiting, unspecified: Secondary | ICD-10-CM

## 2017-12-26 DIAGNOSIS — K859 Acute pancreatitis without necrosis or infection, unspecified: Secondary | ICD-10-CM

## 2017-12-26 DIAGNOSIS — I10 Essential (primary) hypertension: Secondary | ICD-10-CM

## 2017-12-26 DIAGNOSIS — R197 Diarrhea, unspecified: Principal | ICD-10-CM

## 2017-12-26 DIAGNOSIS — E1165 Type 2 diabetes mellitus with hyperglycemia: Secondary | ICD-10-CM

## 2017-12-26 HISTORY — DX: Acute pancreatitis without necrosis or infection, unspecified: K85.90

## 2017-12-26 LAB — URINALYSIS, ROUTINE W REFLEX MICROSCOPIC
BILIRUBIN URINE: NEGATIVE
Bacteria, UA: NONE SEEN
Glucose, UA: 150 mg/dL — AB
KETONES UR: NEGATIVE mg/dL
LEUKOCYTES UA: NEGATIVE
Nitrite: NEGATIVE
PH: 5 (ref 5.0–8.0)
PROTEIN: 30 mg/dL — AB
Specific Gravity, Urine: 1.02 (ref 1.005–1.030)

## 2017-12-26 LAB — CBC
HEMATOCRIT: 40.7 % (ref 39.0–52.0)
HEMOGLOBIN: 13.7 g/dL (ref 13.0–17.0)
MCH: 32.5 pg (ref 26.0–34.0)
MCHC: 33.7 g/dL (ref 30.0–36.0)
MCV: 96.7 fL (ref 78.0–100.0)
Platelets: 233 10*3/uL (ref 150–400)
RBC: 4.21 MIL/uL — AB (ref 4.22–5.81)
RDW: 14.5 % (ref 11.5–15.5)
WBC: 10.4 10*3/uL (ref 4.0–10.5)

## 2017-12-26 LAB — COMPREHENSIVE METABOLIC PANEL
ALT: 32 U/L (ref 17–63)
ANION GAP: 14 (ref 5–15)
AST: 40 U/L (ref 15–41)
Albumin: 4.7 g/dL (ref 3.5–5.0)
Alkaline Phosphatase: 54 U/L (ref 38–126)
BUN: 6 mg/dL (ref 6–20)
CHLORIDE: 98 mmol/L — AB (ref 101–111)
CO2: 25 mmol/L (ref 22–32)
Calcium: 8.9 mg/dL (ref 8.9–10.3)
Creatinine, Ser: 0.85 mg/dL (ref 0.61–1.24)
GFR calc Af Amer: >60 mL/min (ref >60)
Glucose, Bld: 163 mg/dL — ABNORMAL HIGH (ref 65–99)
POTASSIUM: 3.5 mmol/L (ref 3.5–5.1)
SODIUM: 137 mmol/L (ref 135–145)
Total Bilirubin: 1.5 mg/dL — ABNORMAL HIGH (ref 0.3–1.2)
Total Protein: 8 g/dL (ref 6.5–8.1)

## 2017-12-26 LAB — LIPASE, BLOOD: LIPASE: 359 U/L — AB (ref 11–51)

## 2017-12-26 MED ORDER — HYDROCODONE-ACETAMINOPHEN 5-325 MG PO TABS: 1.0000 | ORAL_TABLET | Freq: Four times a day (QID) | ORAL | 0 refills | Status: DC | PRN

## 2017-12-26 MED ORDER — ONDANSETRON 8 MG PO TBDP: ORAL_TABLET | ORAL | 0 refills | Status: DC

## 2017-12-26 MED ORDER — FENTANYL CITRATE (PF) 100 MCG/2ML IJ SOLN
50.0000 ug | Freq: Once | INTRAMUSCULAR | Status: AC
  Administered 2017-12-26: 50 ug via INTRAVENOUS
  Filled 2017-12-26: qty 2

## 2017-12-26 MED ORDER — ONDANSETRON HCL 4 MG/2ML IJ SOLN
4.0000 mg | Freq: Once | INTRAMUSCULAR | Status: AC
  Administered 2017-12-26: 4 mg via INTRAVENOUS
  Filled 2017-12-26: qty 2

## 2017-12-26 MED ORDER — SODIUM CHLORIDE 0.9 % IV BOLUS (SEPSIS)
1000.0000 mL | Freq: Once | INTRAVENOUS | Status: AC
  Administered 2017-12-26: 1000 mL via INTRAVENOUS

## 2017-12-26 MED ORDER — SODIUM CHLORIDE 0.9 % IV SOLN
INTRAVENOUS | Status: DC
  Administered 2017-12-27 (×3): via INTRAVENOUS

## 2017-12-26 MED ORDER — IOPAMIDOL (ISOVUE-300) INJECTION 61%
100.0000 mL | Freq: Once | INTRAVENOUS | Status: AC | PRN
  Administered 2017-12-26: 100 mL via INTRAVENOUS

## 2017-12-26 MED ORDER — HYDROMORPHONE HCL 1 MG/ML IJ SOLN
1.0000 mg | Freq: Once | INTRAMUSCULAR | Status: AC
  Administered 2017-12-26: 1 mg via INTRAVENOUS
  Filled 2017-12-26: qty 1

## 2017-12-26 NOTE — ED Triage Notes (Signed)
Pt reports continued abd pain and emesis since last night. Pt seen for same and discharged from ED this am.

## 2017-12-26 NOTE — ED Provider Notes (Signed)
[facility] MEDICAL SURGICAL UNIT Provider Note   CSN: [id number] Arrival date & time: 12/26/17  1315     History   Chief Complaint Chief Complaint  Patient presents with  . Abdominal Pain    HPI Matthew Moody is a 47 y.o. male.  HPI Presents with 2 days of mid abdominal pain associated with nausea and vomiting.  Was seen yesterday evening.  Had mildly elevated lipase at that time.  Was treated with IV fluids, pain medication and nausea medication.  Patient states he went home today and had a small amount of soup with return of the abdominal pain and 3 episodes of vomiting.  Vomiting is nonbilious and nonbloody.  No diarrhea, melanotic or grossly bloody stools.  Patient has subjective fevers and chills.  Denies regular or heavy alcohol use.  No previous abdominal surgeries. Past Medical History:  Diagnosis Date  . Pancreatitis 2007    Patient Active Problem List   Diagnosis Date Noted  . Pancreatitis, recurrent (HCC) 12/27/2017  . Accelerated hypertension 12/27/2017  . Pancreatitis 12/27/2017  . Acute alcoholic pancreatitis 12/27/2017  . Alcohol dependence (HCC) 12/27/2017  . Essential hypertension 12/27/2017  . Hyperglycemia 12/27/2017    History reviewed. No pertinent surgical history.     Home Medications    Prior to Admission medications   Medication Sig Start Date End Date Taking? Authorizing Provider  HYDROcodone-acetaminophen (NORCO/VICODIN) 5-325 MG tablet Take 1 tablet by mouth every 6 (six) hours as needed for moderate pain. 12/26/17  Yes Zadie Rhine, MD  Multiple Vitamin (MULTIVITAMIN WITH MINERALS) TABS tablet Take 1 tablet by mouth daily.   Yes [provider]  ondansetron (ZOFRAN) 4 MG tablet Take 1 tablet (4 mg total) by mouth every 8 (eight) hours as needed for nausea or vomiting. 06/26/15  Yes Samuel Jester, DO  ondansetron (ZOFRAN ODT) 8 MG disintegrating tablet 8mg  ODT q4 hours prn nausea Patient not taking: Reported on 12/26/2017 12/26/17    Zadie Rhine, MD    Family History Family History  Problem Relation Age of Onset  . Hypertension Mother     Social History Social History   Tobacco Use  . Smoking status: Never Smoker  . Smokeless tobacco: Never Used  Substance Use Topics  . Alcohol use: Yes    Alcohol/week: 1.2 oz    Types: 1 Cans of beer, 1 Shots of liquor per week    Comment: soc  . Drug use: No     Allergies   Patient has no known allergies.   Review of Systems Review of Systems  Constitutional: Positive for chills, diaphoresis and fever.  Respiratory: Negative for shortness of breath.   Cardiovascular: Negative for chest pain.  Gastrointestinal: Positive for abdominal pain, nausea and vomiting. Negative for constipation and diarrhea.  Genitourinary: Negative for dysuria, flank pain, frequency and hematuria.  Musculoskeletal: Negative for back pain, myalgias, neck pain and neck stiffness.  Skin: Negative for rash and wound.  Neurological: Negative for dizziness, weakness, light-headedness, numbness and headaches.  All other systems reviewed and are negative.    Physical Exam Updated Vital Signs BP (!) 168/96 (BP Location: Right Arm)   Pulse 96   Temp 98.7 F (37.1 C) (Oral)   Resp 19   Ht 5\' 8"  (1.727 m)   Wt 87.6 kg (193 lb 2 oz)   SpO2 97%   BMI 29.36 kg/m   Physical Exam   ED Treatments / Results  Labs (all labs ordered are listed, but only abnormal results  are displayed) Labs Reviewed  LIPASE, BLOOD - Abnormal; Notable for the following components:      Result Value   Lipase 359 (*)    All other components within normal limits  COMPREHENSIVE METABOLIC PANEL - Abnormal; Notable for the following components:   Chloride 98 (*)    Glucose, Bld 163 (*)    Total Bilirubin 1.5 (*)    All other components within normal limits  CBC - Abnormal; Notable for the following components:   RBC 4.21 (*)    All other components within normal limits  URINALYSIS, ROUTINE W REFLEX  MICROSCOPIC - Abnormal; Notable for the following components:   APPearance HAZY (*)    Glucose, UA 150 (*)    Hgb urine dipstick SMALL (*)    Protein, ur 30 (*)    Squamous Epithelial / LPF 0-5 (*)    All other components within normal limits  LIPID PANEL - Abnormal; Notable for the following components:   Triglycerides 151 (*)    All other components within normal limits  COMPREHENSIVE METABOLIC PANEL - Abnormal; Notable for the following components:   Sodium 133 (*)    Chloride 100 (*)    Glucose, Bld 139 (*)    BUN <5 (*)    Calcium 7.9 (*)    All other components within normal limits  HEMOGLOBIN A1C - Abnormal; Notable for the following components:   Hgb A1c MFr Bld 5.7 (*)    All other components within normal limits  RAPID URINE DRUG SCREEN, HOSP PERFORMED - Abnormal; Notable for the following components:   Opiates POSITIVE (*)    All other components within normal limits  TSH  HIV ANTIBODY (ROUTINE TESTING)    EKG  EKG Interpretation None       Radiology Ct Abdomen Pelvis W Contrast  Result Date: 12/26/2017 CLINICAL DATA:  Abdominal pain and emesis since last evening. EXAM: CT ABDOMEN AND PELVIS WITH CONTRAST TECHNIQUE: Multidetector CT imaging of the abdomen and pelvis was performed using the standard protocol following bolus administration of intravenous contrast. CONTRAST:  ISOVUE-300 IOPAMIDOL (ISOVUE-300) INJECTION 61% COMPARISON:  Radiographs from 08/20/2013 FINDINGS: Lower chest: Dependent atelectasis. Top normal size heart without pericardial effusion. Hepatobiliary: Mild hepatic steatosis. Tiny gallstone or polyp along the dependent wall of the gallbladder, series 2, image 37. No secondary signs of acute cholecystitis. No biliary dilatation. Pancreas: Edematous appearance of the pancreas with peripancreatic edema and inflammation consistent with acute pancreatitis. No pancreatic gland necrosis or definite pseudocyst formation. No ductal dilatation. Spleen:  Normal size spleen without mass. Adrenals/Urinary Tract: Normal bilateral adrenal glands. Symmetric enhancement of both kidneys without obstructive uropathy. Normal bladder. Stomach/Bowel: Decompressed stomach. Normal small bowel rotation without obstruction. Appendix is unremarkable adjacent to the right hepatic lobe. No large bowel inflammation or obstruction. Vascular/Lymphatic: No significant vascular findings are present. No enlarged abdominal or pelvic lymph nodes. Reproductive: Prostate and seminal vesicles are unremarkable. Other: Trace fluid tracks along the right pericolic gutter into the right hemipelvis. Musculoskeletal: Lumbosacral transitional vertebral anatomy with degenerative disc disease at L5-S1. IMPRESSION: 1. Acute pancreatitis with moderate peripancreatic edema and inflammation with tracking of fluid into the pelvis along the right paracolic gutter. No definite pancreatic gland necrosis though there are scattered areas of hypodensity noted within that may reflect areas of interdigitating fluid and the edema. No pseudocysts. 2. Hepatic steatosis. 3. Tiny gallstone or polyp along the dependent wall of the gallbladder. Electronically Signed   By: Tollie Eth M.D.   On:  12/26/2017 23:33   [place] Abdomen Limited Ruq  Result Date: 12/27/2017 CLINICAL DATA:  Possible polyp or stone within the gallbladder. History of pancreatitis. EXAM: ULTRASOUND ABDOMEN LIMITED RIGHT UPPER QUADRANT COMPARISON:  CT abdomen pelvis 12/26/2017. FINDINGS: Gallbladder: Sludge within the gallbladder lumen. No gallbladder wall thickening or pericholecystic fluid. Negative sonographic Murphy sign. Common bile duct: Diameter: 5 mm Liver: Liver is diffusely increased in echogenicity. No focal lesion identified. Portal vein is patent on color Doppler imaging with normal direction of blood flow towards the liver. IMPRESSION: Sludge within the gallbladder lumen. No secondary signs to suggest acute cholecystitis. Hepatic steatosis.  Electronically Signed   By: Annia Beltrew  Davis M.D.   On: 12/27/2017 10:00    Procedures Procedures (including critical care time)  Medications Ordered in ED Medications  0.9 %  sodium chloride infusion ( Intravenous New Bag/Given 12/27/17 1112)  HYDROmorphone (DILAUDID) injection 1 mg (1 mg Intravenous Given 12/27/17 1112)  ondansetron (ZOFRAN) injection 4 mg (4 mg Intravenous Given 12/27/17 0120)  enoxaparin (LOVENOX) injection 40 mg (40 mg Subcutaneous Given 12/27/17 0120)  acetaminophen (TYLENOL) tablet 650 mg (not administered)    Or  acetaminophen (TYLENOL) suppository 650 mg (not administered)  hydrALAZINE (APRESOLINE) injection 10 mg (not administered)  amLODipine (NORVASC) tablet 5 mg (not administered)  iopamidol (ISOVUE-300) 61 % injection 100 mL (100 mLs Intravenous Contrast Given 12/26/17 2253)  sodium chloride 0.9 % bolus 1,000 mL (0 mLs Intravenous Stopped 12/26/17 2340)  HYDROmorphone (DILAUDID) injection 1 mg (1 mg Intravenous Given 12/26/17 2229)  ondansetron (ZOFRAN) injection 4 mg (4 mg Intravenous Given 12/26/17 2229)  sodium chloride 0.9 % bolus 1,000 mL (0 mLs Intravenous Stopped 12/27/17 0041)     Initial Impression / Assessment and Plan / ED Course  I have reviewed the triage vital signs and the nursing notes.  Pertinent labs & imaging results that were available during my care of the patient were reviewed by me and considered in my medical decision making (see chart for details).     Signed out to oncoming EDP pending CT abd. Suspect likely admission  Final Clinical Impressions(s) / ED Diagnoses   Final diagnoses:  Acute pancreatitis, unspecified complication status, unspecified pancreatitis type    ED Discharge Orders    None       Loren RacerYelverton, Kaeo Jacome, MD 12/27/17 1609

## 2017-12-26 NOTE — ED Notes (Signed)
Water given  

## 2017-12-26 NOTE — Discharge Instructions (Signed)

## 2017-12-26 NOTE — ED Provider Notes (Signed)
[facility] EMERGENCY DEPARTMENT Provider Note   CSN: [id number] Arrival date & time: 12/25/17  2152     History   Chief Complaint Chief Complaint  Patient presents with  . Emesis    HPI Matthew Moody is a 47 y.o. male.  The history is provided by the patient.  Emesis   This is a new problem. The current episode started 2 days ago. The problem has been gradually worsening. There has been no fever. Associated symptoms include abdominal pain and diarrhea. Pertinent negatives include no cough and no fever.  Patient presents with vomiting and diarrhea, for the past 2 days. There is no blood in the vomit or stool He reports some mild abdominal pain as well He has no other complaints     PMH - none Soc hx - denies travel, no ETOH abuse Home Medications    Prior to Admission medications   Medication Sig Start Date End Date Taking? Authorizing Provider  diazepam (VALIUM) 5 MG tablet Take 1 tablet (5 mg total) by mouth at bedtime as needed for muscle spasms. Patient not taking: Reported on 06/26/2015 03/07/14   Irish EldersWalker, Kelly, FNP  dicyclomine (BENTYL) 20 MG tablet Take 1 tablet (20 mg total) by mouth every 6 (six) hours as needed for spasms (abdominal cramping). 06/26/15   Samuel JesterMcManus, Kathleen, DO  HYDROcodone-acetaminophen (NORCO/VICODIN) 5-325 MG per tablet Take 2 tablets by mouth every 6 (six) hours as needed. Patient not taking: Reported on 06/26/2015 03/07/14   Irish EldersWalker, Kelly, FNP  Multiple Vitamin (MULTIVITAMIN WITH MINERALS) TABS tablet Take 1 tablet by mouth daily.    [provider]  ondansetron (ZOFRAN) 4 MG tablet Take 1 tablet (4 mg total) by mouth every 8 (eight) hours as needed for nausea or vomiting. 06/26/15   Samuel JesterMcManus, Kathleen, DO    Family History No family history on file.  Social History Social History   Tobacco Use  . Smoking status: Never Smoker  . Smokeless tobacco: Never Used  Substance Use Topics  . Alcohol use: Yes    Comment: soc  . Drug use: No      Allergies   Patient has no known allergies.   Review of Systems Review of Systems  Constitutional: Negative for fever.  Respiratory: Negative for cough.   Gastrointestinal: Positive for abdominal pain, diarrhea and vomiting. Negative for blood in stool.  All other systems reviewed and are negative.    Physical Exam Updated Vital Signs BP (!) 185/103   Pulse 69   Temp 98.8 F (37.1 C) (Oral)   Resp 14   Ht 1.727 m (5\' 8" )   Wt 93 kg (205 lb)   SpO2 100%   BMI 31.17 kg/m   Physical Exam CONSTITUTIONAL: Well developed/well nourished HEAD: Normocephalic/atraumatic EYES: EOMI/PERRL, no icterus ENMT: Mucous membranes moist NECK: supple no meningeal signs SPINE/BACK:entire spine nontender CV: S1/S2 noted, no murmurs/rubs/gallops noted LUNGS: Lungs are clear to auscultation bilaterally, no apparent distress ABDOMEN: soft, mild diffuse tenderness, no rebound or guarding, bowel sounds noted throughout abdomen GU:no cva tenderness NEURO: Pt is awake/alert/appropriate, moves all extremitiesx4.  No facial droop.   EXTREMITIES: pulses normal/equal, full ROM SKIN: warm, color normal PSYCH: no abnormalities of mood noted, alert and oriented to situation  ED Treatments / Results  Labs (all labs ordered are listed, but only abnormal results are displayed) Labs Reviewed  LIPASE, BLOOD - Abnormal; Notable for the following components:      Result Value   Lipase 135 (*)  All other components within normal limits  COMPREHENSIVE METABOLIC PANEL - Abnormal; Notable for the following components:   Potassium 3.3 (*)    Chloride 99 (*)    Glucose, Bld 126 (*)    AST 64 (*)    Total Bilirubin 1.3 (*)    Anion gap 16 (*)    All other components within normal limits  CBC - Abnormal; Notable for the following components:   RBC 4.16 (*)    All other components within normal limits  URINALYSIS, ROUTINE W REFLEX MICROSCOPIC - Abnormal; Notable for the following components:    Color, Urine AMBER (*)    APPearance HAZY (*)    Protein, ur 30 (*)    Squamous Epithelial / LPF 0-5 (*)    All other components within normal limits    EKG  EKG Interpretation None       Radiology No results found.  Procedures Procedures (including critical care time)  Medications Ordered in ED Medications  sodium chloride 0.9 % bolus 1,000 mL (0 mLs Intravenous Stopped 12/26/17 0440)  ondansetron (ZOFRAN) injection 4 mg (4 mg Intravenous Given 12/26/17 0326)  fentaNYL (SUBLIMAZE) injection 50 mcg (50 mcg Intravenous Given 12/26/17 0326)     Initial Impression / Assessment and Plan / ED Course  I have reviewed the triage vital signs and the nursing notes.  Pertinent labs  results that were available during my care of the patient were reviewed by me and considered in my medical decision making (see chart for details). Narcotic database reviewed and considered in decision making     Patient With nausea vomiting diarrhea and abdominal pain. Mildly dehydrated, with mildly elevated lipase Repeat assessment he is taking p.o., abdominal pain is improved and he is nontoxic Plan will be to discharge home with short course of pain medicine and nausea medicines. I discussed need for liquid diet over the next few days We discussed strict return precautions Final Clinical Impressions(s) / ED Diagnoses   Final diagnoses:  Nausea vomiting and diarrhea  Epigastric pain    ED Discharge Orders        Ordered    HYDROcodone-acetaminophen (NORCO/VICODIN) 5-325 MG tablet  Every 6 hours PRN     12/26/17 0534    ondansetron (ZOFRAN ODT) 8 MG disintegrating tablet     12/26/17 0534       Zadie Rhine, MD 12/26/17 [phone]

## 2017-12-26 NOTE — ED Provider Notes (Signed)
Results for orders placed or performed during the hospital encounter of 12/26/17  Lipase, blood  Result Value Ref Range   Lipase 359 (H) 11 - 51 U/L  Comprehensive metabolic panel  Result Value Ref Range   Sodium 137 135 - 145 mmol/L   Potassium 3.5 3.5 - 5.1 mmol/L   Chloride 98 (L) 101 - 111 mmol/L   CO2 25 22 - 32 mmol/L   Glucose, Bld 163 (H) 65 - 99 mg/dL   BUN 6 6 - 20 mg/dL   Creatinine, Ser 1.61 0.61 - 1.24 mg/dL   Calcium 8.9 8.9 - 09.6 mg/dL   Total Protein 8.0 6.5 - 8.1 g/dL   Albumin 4.7 3.5 - 5.0 g/dL   AST 40 15 - 41 U/L   ALT 32 17 - 63 U/L   Alkaline Phosphatase 54 38 - 126 U/L   Total Bilirubin 1.5 (H) 0.3 - 1.2 mg/dL   GFR calc non Af Amer >60 >60 mL/min   GFR calc Af Amer >60 >60 mL/min   Anion gap 14 5 - 15  CBC  Result Value Ref Range   WBC 10.4 4.0 - 10.5 K/uL   RBC 4.21 (L) 4.22 - 5.81 MIL/uL   Hemoglobin 13.7 13.0 - 17.0 g/dL   HCT 04.5 40.9 - 81.1 %   MCV 96.7 78.0 - 100.0 fL   MCH 32.5 26.0 - 34.0 pg   MCHC 33.7 30.0 - 36.0 g/dL   RDW 91.4 78.2 - 95.6 %   Platelets 233 150 - 400 K/uL  Urinalysis, Routine w reflex microscopic  Result Value Ref Range   Color, Urine YELLOW YELLOW   APPearance HAZY (A) CLEAR   Specific Gravity, Urine 1.020 1.005 - 1.030   pH 5.0 5.0 - 8.0   Glucose, UA 150 (A) NEGATIVE mg/dL   Hgb urine dipstick SMALL (A) NEGATIVE   Bilirubin Urine NEGATIVE NEGATIVE   Ketones, ur NEGATIVE NEGATIVE mg/dL   Protein, ur 30 (A) NEGATIVE mg/dL   Nitrite NEGATIVE NEGATIVE   Leukocytes, UA NEGATIVE NEGATIVE   RBC / HPF 0-5 0 - 5 RBC/hpf   WBC, UA 0-5 0 - 5 WBC/hpf   Bacteria, UA NONE SEEN NONE SEEN   Squamous Epithelial / LPF 0-5 (A) NONE SEEN   Mucus PRESENT    Ct Abdomen Pelvis W Contrast  Result Date: 12/26/2017 CLINICAL DATA:  Abdominal pain and emesis since last evening. EXAM: CT ABDOMEN AND PELVIS WITH CONTRAST TECHNIQUE: Multidetector CT imaging of the abdomen and pelvis was performed using the standard protocol following  bolus administration of intravenous contrast. CONTRAST:  ISOVUE-300 IOPAMIDOL (ISOVUE-300) INJECTION 61% COMPARISON:  Radiographs from 08/20/2013 FINDINGS: Lower chest: Dependent atelectasis. Top normal size heart without pericardial effusion. Hepatobiliary: Mild hepatic steatosis. Tiny gallstone or polyp along the dependent wall of the gallbladder, series 2, image 37. No secondary signs of acute cholecystitis. No biliary dilatation. Pancreas: Edematous appearance of the pancreas with peripancreatic edema and inflammation consistent with acute pancreatitis. No pancreatic gland necrosis or definite pseudocyst formation. No ductal dilatation. Spleen: Normal size spleen without mass. Adrenals/Urinary Tract: Normal bilateral adrenal glands. Symmetric enhancement of both kidneys without obstructive uropathy. Normal bladder. Stomach/Bowel: Decompressed stomach. Normal small bowel rotation without obstruction. Appendix is unremarkable adjacent to the right hepatic lobe. No large bowel inflammation or obstruction. Vascular/Lymphatic: No significant vascular findings are present. No enlarged abdominal or pelvic lymph nodes. Reproductive: Prostate and seminal vesicles are unremarkable. Other: Trace fluid tracks along the right pericolic gutter into  the right hemipelvis. Musculoskeletal: Lumbosacral transitional vertebral anatomy with degenerative disc disease at L5-S1. IMPRESSION: 1. Acute pancreatitis with moderate peripancreatic edema and inflammation with tracking of fluid into the pelvis along the right paracolic gutter. No definite pancreatic gland necrosis though there are scattered areas of hypodensity noted within that may reflect areas of interdigitating fluid and the edema. No pseudocysts. 2. Hepatic steatosis. 3. Tiny gallstone or polyp along the dependent wall of the gallbladder. Electronically Signed   By: Tollie Ethavid  Kwon M.D.   On: 12/26/2017 23:33    CT scan shows significant pancreatitis with correlates  with patient's labs findings of his lipase increasing from like 150-300 here today.  Patient with persistent nausea and vomiting BUN and creatinine renal function normal.  No evidence of pseudocyst or abscess.  Patient warrants admission for the worsening pancreatitis.   Vanetta MuldersZackowski, Jeffre Enriques, MD 12/26/17 [phone]

## 2017-12-27 ENCOUNTER — Other Ambulatory Visit: Payer: Self-pay

## 2017-12-27 ENCOUNTER — Encounter (HOSPITAL_COMMUNITY): Payer: Self-pay | Admitting: General Practice

## 2017-12-27 ENCOUNTER — Observation Stay (HOSPITAL_COMMUNITY): Payer: Managed Care, Other (non HMO)

## 2017-12-27 DIAGNOSIS — F1020 Alcohol dependence, uncomplicated: Secondary | ICD-10-CM

## 2017-12-27 DIAGNOSIS — I10 Essential (primary) hypertension: Secondary | ICD-10-CM | POA: Diagnosis not present

## 2017-12-27 DIAGNOSIS — K859 Acute pancreatitis without necrosis or infection, unspecified: Secondary | ICD-10-CM | POA: Diagnosis not present

## 2017-12-27 DIAGNOSIS — E876 Hypokalemia: Secondary | ICD-10-CM | POA: Diagnosis not present

## 2017-12-27 DIAGNOSIS — K861 Other chronic pancreatitis: Secondary | ICD-10-CM | POA: Diagnosis not present

## 2017-12-27 DIAGNOSIS — R739 Hyperglycemia, unspecified: Secondary | ICD-10-CM | POA: Diagnosis not present

## 2017-12-27 DIAGNOSIS — R7302 Impaired glucose tolerance (oral): Secondary | ICD-10-CM | POA: Diagnosis not present

## 2017-12-27 DIAGNOSIS — Z8719 Personal history of other diseases of the digestive system: Secondary | ICD-10-CM

## 2017-12-27 DIAGNOSIS — E1165 Type 2 diabetes mellitus with hyperglycemia: Secondary | ICD-10-CM

## 2017-12-27 DIAGNOSIS — K852 Alcohol induced acute pancreatitis without necrosis or infection: Secondary | ICD-10-CM | POA: Diagnosis not present

## 2017-12-27 DIAGNOSIS — E1169 Type 2 diabetes mellitus with other specified complication: Secondary | ICD-10-CM

## 2017-12-27 DIAGNOSIS — F1029 Alcohol dependence with unspecified alcohol-induced disorder: Secondary | ICD-10-CM | POA: Diagnosis not present

## 2017-12-27 DIAGNOSIS — F10988 Alcohol use, unspecified with other alcohol-induced disorder: Secondary | ICD-10-CM | POA: Diagnosis not present

## 2017-12-27 LAB — TSH: TSH: 2.927 u[IU]/mL (ref 0.350–4.500)

## 2017-12-27 LAB — LIPID PANEL
CHOL/HDL RATIO: 2.6 ratio
CHOLESTEROL: 189 mg/dL (ref 0–200)
HDL: 73 mg/dL (ref >40)
LDL Cholesterol: 86 mg/dL (ref 0–99)
Triglycerides: 151 mg/dL — ABNORMAL HIGH (ref <150)
VLDL: 30 mg/dL (ref 0–40)

## 2017-12-27 LAB — COMPREHENSIVE METABOLIC PANEL
ALK PHOS: 43 U/L (ref 38–126)
ALT: 24 U/L (ref 17–63)
AST: 27 U/L (ref 15–41)
Albumin: 3.8 g/dL (ref 3.5–5.0)
Anion gap: 10 (ref 5–15)
BUN: <5 mg/dL — ABNORMAL LOW (ref 6–20)
CALCIUM: 7.9 mg/dL — AB (ref 8.9–10.3)
CO2: 23 mmol/L (ref 22–32)
CREATININE: 0.72 mg/dL (ref 0.61–1.24)
Chloride: 100 mmol/L — ABNORMAL LOW (ref 101–111)
Glucose, Bld: 139 mg/dL — ABNORMAL HIGH (ref 65–99)
Potassium: 3.5 mmol/L (ref 3.5–5.1)
Sodium: 133 mmol/L — ABNORMAL LOW (ref 135–145)
Total Bilirubin: 1 mg/dL (ref 0.3–1.2)
Total Protein: 6.7 g/dL (ref 6.5–8.1)

## 2017-12-27 LAB — RAPID URINE DRUG SCREEN, HOSP PERFORMED
Amphetamines: NOT DETECTED
Barbiturates: NOT DETECTED
Benzodiazepines: NOT DETECTED
Cocaine: NOT DETECTED
Opiates: POSITIVE — AB
Tetrahydrocannabinol: NOT DETECTED

## 2017-12-27 LAB — HEMOGLOBIN A1C
HEMOGLOBIN A1C: 5.7 % — AB (ref 4.8–5.6)
MEAN PLASMA GLUCOSE: 116.89 mg/dL

## 2017-12-27 MED ORDER — ACETAMINOPHEN 650 MG RE SUPP: 650.0000 mg | Freq: Four times a day (QID) | RECTAL | Status: DC | PRN

## 2017-12-27 MED ORDER — ENOXAPARIN SODIUM 40 MG/0.4ML ~~LOC~~ SOLN
40.0000 mg | SUBCUTANEOUS | Status: DC
  Administered 2017-12-27 – 2017-12-29 (×3): 40 mg via SUBCUTANEOUS
  Filled 2017-12-27 (×3): qty 0.4

## 2017-12-27 MED ORDER — HYDROMORPHONE HCL 1 MG/ML IJ SOLN
1.0000 mg | INTRAMUSCULAR | Status: DC | PRN
  Administered 2017-12-27 – 2017-12-28 (×7): 1 mg via INTRAVENOUS
  Filled 2017-12-27 (×7): qty 1

## 2017-12-27 MED ORDER — ACETAMINOPHEN 325 MG PO TABS: 650.0000 mg | ORAL_TABLET | Freq: Four times a day (QID) | ORAL | Status: DC | PRN

## 2017-12-27 MED ORDER — ONDANSETRON HCL 4 MG/2ML IJ SOLN
4.0000 mg | Freq: Four times a day (QID) | INTRAMUSCULAR | Status: DC | PRN
  Administered 2017-12-27: 4 mg via INTRAVENOUS
  Filled 2017-12-27: qty 2

## 2017-12-27 MED ORDER — AMLODIPINE BESYLATE 5 MG PO TABS
5.0000 mg | ORAL_TABLET | Freq: Every day | ORAL | Status: DC
  Administered 2017-12-27: 5 mg via ORAL
  Filled 2017-12-27: qty 1

## 2017-12-27 MED ORDER — AMLODIPINE BESYLATE 5 MG PO TABS
5.0000 mg | ORAL_TABLET | ORAL | Status: DC
Start: 2017-12-27 — End: 2017-12-28
  Administered 2017-12-27: 5 mg via ORAL
  Filled 2017-12-27: qty 1

## 2017-12-27 MED ORDER — HYDRALAZINE HCL 20 MG/ML IJ SOLN: 10.0000 mg | INTRAMUSCULAR | Status: DC | PRN

## 2017-12-27 NOTE — H&P (Signed)
History and Physical    Matthew Moody XBJ:[id number] DOB: [birth year] DOA: 12/26/2017  PCP: Patient, No Pcp Per   Patient coming from: Home  Chief Complaint: Generalized abdominal pain with N/V  HPI: Matthew Moody is a 47 y.o. male with medical history significant for prior pancreatitis who presented to the emergency department yesterday with complaints of abdominal pain as well as nausea and vomiting which improved, and therefore he was sent home.  He presented back today with similar symptoms that recurred.  He states that his abdominal pain is dull, and aching in nature and radiates to his flanks.  No specific aggravating or alleviating factors noted.  He has had some ongoing nausea and vomiting associated with the pain.  He seems to think that much of this began after he binged on some alcohol 2-3 days ago.  He denies any fever, chills, diarrhea, or any other symptomatology.   ED Course: Vital signs are stable but he is noted to be quite hypertensive in the ED.  Laboratory data significant for a lipase of 359.  LFTs within normal limits.  CT of the abdomen demonstrates findings of pancreatitis and there appears to be a gallstone as well with no common bile duct dilation.  Review of Systems: As per HPI otherwise 10 point review of systems negative.   History reviewed. No pertinent past medical history.  History reviewed. No pertinent surgical history.   reports that  has never smoked. he has never used smokeless tobacco. He reports that he drinks alcohol. He reports that he does not use drugs.  No Known Allergies  History reviewed. No pertinent family history.  Prior to Admission medications   Medication Sig Start Date End Date Taking? Authorizing Provider  HYDROcodone-acetaminophen (NORCO/VICODIN) 5-325 MG tablet Take 1 tablet by mouth every 6 (six) hours as needed for moderate pain. 12/26/17  Yes Zadie Rhine, MD  Multiple Vitamin (MULTIVITAMIN WITH MINERALS) TABS tablet Take 1  tablet by mouth daily.   Yes [provider]  ondansetron (ZOFRAN) 4 MG tablet Take 1 tablet (4 mg total) by mouth every 8 (eight) hours as needed for nausea or vomiting. 06/26/15  Yes Samuel Jester, DO  ondansetron (ZOFRAN ODT) 8 MG disintegrating tablet 8mg  ODT q4 hours prn nausea Patient not taking: Reported on 12/26/2017 12/26/17   Zadie Rhine, MD    Physical Exam: Vitals:   12/26/17 1323 12/26/17 1518 12/26/17 1749 12/26/17 2340  BP: (!) 181/108 (!) 177/100 (!) 183/96 (!) 173/100  Pulse: 64 68 64 71  Resp: 18 18 18 18   Temp: 98.5 F (36.9 C) 98.3 F (36.8 C) 98.2 F (36.8 C)   TempSrc: Oral Oral Oral   SpO2: 100% 100% 100% 99%  Weight: 93 kg (205 lb)     Height: 5\' 8"  (1.727 m)       Constitutional: NAD, calm, comfortable Vitals:   12/26/17 1323 12/26/17 1518 12/26/17 1749 12/26/17 2340  BP: (!) 181/108 (!) 177/100 (!) 183/96 (!) 173/100  Pulse: 64 68 64 71  Resp: 18 18 18 18   Temp: 98.5 F (36.9 C) 98.3 F (36.8 C) 98.2 F (36.8 C)   TempSrc: Oral Oral Oral   SpO2: 100% 100% 100% 99%  Weight: 93 kg (205 lb)     Height: 5\' 8"  (1.727 m)      Eyes: lids and conjunctivae normal ENMT: Mucous membranes are moist.  Neck: normal, supple Respiratory: clear to auscultation bilaterally. Normal respiratory effort. No accessory muscle use.  Cardiovascular: Regular  rate and rhythm, no murmurs. No extremity edema. Abdomen: no tenderness, no distention. Bowel sounds positive.  Musculoskeletal:  No joint deformity upper and lower extremities.   Skin: no rashes, lesions, ulcers.  Psychiatric: Normal judgment and insight. Alert and oriented x 3. Normal mood.   Labs on Admission: I have personally reviewed following labs and imaging studies  CBC: Recent Labs  Lab 12/25/17 2203 12/26/17 1637  WBC 8.6 10.4  HGB 13.7 13.7  HCT 39.7 40.7  MCV 95.4 96.7  PLT 254 233   Basic Metabolic Panel: Recent Labs  Lab 12/25/17 2203 12/26/17 1637  NA 138 137  K 3.3* 3.5   CL 99* 98*  CO2 23 25  GLUCOSE 126* 163*  BUN 9 6  CREATININE 0.95 0.85  CALCIUM 9.1 8.9   GFR: Estimated Creatinine Clearance: 122.7 mL/min (by C-G formula based on SCr of 0.85 mg/dL). Liver Function Tests: Recent Labs  Lab 12/25/17 2203 12/26/17 1637  AST 64* 40  ALT 40 32  ALKPHOS 47 54  BILITOT 1.3* 1.5*  PROT 8.0 8.0  ALBUMIN 4.7 4.7   Recent Labs  Lab 12/25/17 2203 12/26/17 1637  LIPASE 135* 359*   No results for input(s): AMMONIA in the last 168 hours. Coagulation Profile: No results for input(s): INR, PROTIME in the last 168 hours. Cardiac Enzymes: No results for input(s): CKTOTAL, CKMB, CKMBINDEX, TROPONINI in the last 168 hours. BNP (last 3 results) No results for input(s): PROBNP in the last 8760 hours. HbA1C: No results for input(s): HGBA1C in the last 72 hours. CBG: No results for input(s): GLUCAP in the last 168 hours. Lipid Profile: No results for input(s): CHOL, HDL, LDLCALC, TRIG, CHOLHDL, LDLDIRECT in the last 72 hours. Thyroid Function Tests: No results for input(s): TSH, T4TOTAL, FREET4, T3FREE, THYROIDAB in the last 72 hours. Anemia Panel: No results for input(s): VITAMINB12, FOLATE, FERRITIN, TIBC, IRON, RETICCTPCT in the last 72 hours. Urine analysis:    Component Value Date/Time   COLORURINE YELLOW 12/26/2017 2000   APPEARANCEUR HAZY (A) 12/26/2017 2000   LABSPEC 1.020 12/26/2017 2000   PHURINE 5.0 12/26/2017 2000   GLUCOSEU 150 (A) 12/26/2017 2000   HGBUR SMALL (A) 12/26/2017 2000   BILIRUBINUR NEGATIVE 12/26/2017 2000   KETONESUR NEGATIVE 12/26/2017 2000   PROTEINUR 30 (A) 12/26/2017 2000   UROBILINOGEN 0.2 06/26/2015 1551   NITRITE NEGATIVE 12/26/2017 2000   LEUKOCYTESUR NEGATIVE 12/26/2017 2000    Radiological Exams on Admission: Ct Abdomen Pelvis W Contrast  Result Date: 12/26/2017 CLINICAL DATA:  Abdominal pain and emesis since last evening. EXAM: CT ABDOMEN AND PELVIS WITH CONTRAST TECHNIQUE: Multidetector CT imaging of  the abdomen and pelvis was performed using the standard protocol following bolus administration of intravenous contrast. CONTRAST:  ISOVUE-300 IOPAMIDOL (ISOVUE-300) INJECTION 61% COMPARISON:  Radiographs from 08/20/2013 FINDINGS: Lower chest: Dependent atelectasis. Top normal size heart without pericardial effusion. Hepatobiliary: Mild hepatic steatosis. Tiny gallstone or polyp along the dependent wall of the gallbladder, series 2, image 37. No secondary signs of acute cholecystitis. No biliary dilatation. Pancreas: Edematous appearance of the pancreas with peripancreatic edema and inflammation consistent with acute pancreatitis. No pancreatic gland necrosis or definite pseudocyst formation. No ductal dilatation. Spleen: Normal size spleen without mass. Adrenals/Urinary Tract: Normal bilateral adrenal glands. Symmetric enhancement of both kidneys without obstructive uropathy. Normal bladder. Stomach/Bowel: Decompressed stomach. Normal small bowel rotation without obstruction. Appendix is unremarkable adjacent to the right hepatic lobe. No large bowel inflammation or obstruction. Vascular/Lymphatic: No significant vascular findings are present.  No enlarged abdominal or pelvic lymph nodes. Reproductive: Prostate and seminal vesicles are unremarkable. Other: Trace fluid tracks along the right pericolic gutter into the right hemipelvis. Musculoskeletal: Lumbosacral transitional vertebral anatomy with degenerative disc disease at L5-S1. IMPRESSION: 1. Acute pancreatitis with moderate peripancreatic edema and inflammation with tracking of fluid into the pelvis along the right paracolic gutter. No definite pancreatic gland necrosis though there are scattered areas of hypodensity noted within that may reflect areas of interdigitating fluid and the edema. No pseudocysts. 2. Hepatic steatosis. 3. Tiny gallstone or polyp along the dependent wall of the gallbladder. Electronically Signed   By: Tollie Ethavid  Kwon M.D.   On:  12/26/2017 23:33    Assessment/Plan Principal Problem:   Pancreatitis, recurrent (HCC) Active Problems:   Accelerated hypertension   Pancreatitis    Acute pancreatitis likely alcohol related -Cholelithiasis noted on CT as well Bowel rest Continue aggressive IV fluid after 2 L bolus Dilaudid as needed for pain Check lipid panel/TSH Zofran for nausea and vomiting  Hypertension with poor control Hydralazine as needed Start on amlodipine 5 mg  History of occasional alcohol use -This does not appear to be a significant, chronic problem   DVT prophylaxis: Lovenox Code Status: Full Family Communication: Wife Disposition Plan:Home hopefully later tomorrow after diet advancement Consults called:None Admission status: Obs, med-surg   Pratik Hoover BrunetteD Shah DO [facility] Pager [phone]  If 7PM-7AM, please contact night-coverage [email or web address] Password [email or web address]  12/27/2017, 12:10 AM

## 2017-12-27 NOTE — Progress Notes (Addendum)
PROGRESS NOTE  EDREI NORGAARD IYM:[id number] DOB: 1971-06-28 DOA: 12/26/2017 PCP: Patient, No Pcp Per  Brief History:  47 year old male with no listed chronic medical problems presented with 3-4-day history of nausea, vomiting, and abdominal pain.  Patient states that he had a "New Year's party"and began drinking on 12/21/2017.  He was not able to quantify the amount of alcohol, but states that he had a number of mixed drinks, shots of liquor, and beer.  In addition, the patient states that he drinks 2 beers and mixed drinks approximately 3 days out of the week.  He denies any new medications.  He denies any illegal drug use.  He presented to emergency department on 12/26/2017 the patient was treated symptomatically and subsequently released home.. At home, the patient ate some soup and began having worsening abdominal pain and vomiting again.  As result, he return for further evaluation.  In the emergency department, the patient was afebrile hemodynamically stable.  AST 40, ALT 32, alk phosphatase 54, total bilirubin 1.5, lipase 359.  The patient was admitted for treatment of pancreatitis.  Assessment/Plan: Acute pancreatitis -Alcohol related -urine drug screen -Right upper quadrant ultrasound -Lipid panel -HIV -continue IVF -pain control/antiemetics  Essential hypertension -Patient states that he was given some type of antihypertensive medication over 5 years ago--he only took for 1 month, then he was lost for follow-up and has not been on any medication since then -start amlodipine  Hyperglycemia -Hemoglobin A1c  Alcohol dependence -Cessation of alcohol discussed    Disposition Plan:   Home 1/7 if stable Family Communication:   No Family at bedside--Total time spent 35 minutes.  Greater than 50% spent face to face counseling and coordinating care. [phone] to [phone]   Consultants:  none  Code Status:  FULL   DVT Prophylaxis:   / [place] Lovenox   Procedures: As Listed in  Progress Note Above  Antibiotics: None    Subjective: Patient still has some mild abdominal pain, but states it is improving.  He has not had any emesis.  He denies any fevers, chills, chest pain, shortness breath, diarrhea, hematochezia, melena, dysuria, hematuria.  Objective: Vitals:   12/26/17 1749 12/26/17 2340 12/27/17 0112 12/27/17 0400  BP: (!) 183/96 (!) 173/100 (!) 185/98 (!) 102/94  Pulse: 64 71 74 79  Resp: _0 Temp: 98.2 F (36.8 C)  98.4 F (36.9 C)   TempSrc: Oral  Oral   SpO2: 100% 99% 100%   Weight:   87.6 kg (193 lb 2 oz)   Height:   _1  (1.727 m)     Intake/Output Summary (Last 24 hours) at 12/27/2017 0641 Last data filed at 12/27/2017 0041 Gross per 24 hour  Intake 2000 ml  Output -  Net 2000 ml   Weight change:  Exam:   General:  Pt is alert, follows commands appropriately, not in acute distress  HEENT: No icterus, No thrush, No neck mass, [place]/AT  Cardiovascular: RRR, S1/S2, no rubs, no gallops  Respiratory: Bibasilar rales.  No wheezing.  Abdomen: Soft/+BS,  epigastric tender, non distended, no guarding  Extremities: No edema, No lymphangitis, No petechiae, No rashes, no synovitis   Data Reviewed: I have personally reviewed following labs and imaging studies Basic Metabolic Panel: Recent Labs  Lab 12/25/17 2203 12/26/17 1637  NA 138 137  K 3.3* 3.5  CL 99* 98*  CO2 23 25  GLUCOSE 126* 163*  BUN 9 6  CREATININE 0.95 0.85  CALCIUM 9.1 8.9   Liver Function Tests: Recent Labs  Lab 12/25/17 2203 12/26/17 1637  AST 64* 40  ALT 40 32  ALKPHOS 47 54  BILITOT 1.3* 1.5*  PROT 8.0 8.0  ALBUMIN 4.7 4.7   Recent Labs  Lab 12/25/17 2203 12/26/17 1637  LIPASE 135* 359*   No results for input(s): AMMONIA in the last 168 hours. Coagulation Profile: No results for input(s): INR, PROTIME in the last 168 hours. CBC: Recent Labs  Lab 12/25/17 2203 12/26/17 1637  WBC 8.6 10.4  HGB 13.7 13.7  HCT 39.7 40.7  MCV 95.4 96.7    PLT 254 233   Cardiac Enzymes: No results for input(s): CKTOTAL, CKMB, CKMBINDEX, TROPONINI in the last 168 hours. BNP: Invalid input(s): POCBNP CBG: No results for input(s): GLUCAP in the last 168 hours. HbA1C: No results for input(s): HGBA1C in the last 72 hours. Urine analysis:    Component Value Date/Time   COLORURINE YELLOW 12/26/2017 2000   APPEARANCEUR HAZY (A) 12/26/2017 2000   LABSPEC 1.020 12/26/2017 2000   PHURINE 5.0 12/26/2017 2000   GLUCOSEU 150 (A) 12/26/2017 2000   HGBUR SMALL (A) 12/26/2017 2000   BILIRUBINUR NEGATIVE 12/26/2017 2000   KETONESUR NEGATIVE 12/26/2017 2000   PROTEINUR 30 (A) 12/26/2017 2000   UROBILINOGEN 0.2 06/26/2015 1551   NITRITE NEGATIVE 12/26/2017 2000   LEUKOCYTESUR NEGATIVE 12/26/2017 2000   Sepsis Labs: _0 (procalcitonin:4,lacticidven:4) )No results found for this or any previous visit (from the past 240 hour(s)).   Scheduled Meds: . amLODipine  5 mg Oral Daily  . enoxaparin (LOVENOX) injection  40 mg Subcutaneous Q24H   Continuous Infusions: . sodium chloride 100 mL/hr at 12/27/17 0120    Procedures/Studies: Ct Abdomen Pelvis W Contrast  Result Date: 12/26/2017 CLINICAL DATA:  Abdominal pain and emesis since last evening. EXAM: CT ABDOMEN AND PELVIS WITH CONTRAST TECHNIQUE: Multidetector CT imaging of the abdomen and pelvis was performed using the standard protocol following bolus administration of intravenous contrast. CONTRAST:  179m ISOVUE-300 IOPAMIDOL (ISOVUE-300) INJECTION 61% COMPARISON:  Radiographs from 08/20/2013 FINDINGS: Lower chest: Dependent atelectasis. Top normal size heart without pericardial effusion. Hepatobiliary: Mild hepatic steatosis. Tiny gallstone or polyp along the dependent wall of the gallbladder, series 2, image 37. No secondary signs of acute cholecystitis. No biliary dilatation. Pancreas: Edematous appearance of the pancreas with peripancreatic edema and inflammation consistent with acute  pancreatitis. No pancreatic gland necrosis or definite pseudocyst formation. No ductal dilatation. Spleen: Normal size spleen without mass. Adrenals/Urinary Tract: Normal bilateral adrenal glands. Symmetric enhancement of both kidneys without obstructive uropathy. Normal bladder. Stomach/Bowel: Decompressed stomach. Normal small bowel rotation without obstruction. Appendix is unremarkable adjacent to the right hepatic lobe. No large bowel inflammation or obstruction. Vascular/Lymphatic: No significant vascular findings are present. No enlarged abdominal or pelvic lymph nodes. Reproductive: Prostate and seminal vesicles are unremarkable. Other: Trace fluid tracks along the right pericolic gutter into the right hemipelvis. Musculoskeletal: Lumbosacral transitional vertebral anatomy with degenerative disc disease at L5-S1. IMPRESSION: 1. Acute pancreatitis with moderate peripancreatic edema and inflammation with tracking of fluid into the pelvis along the right paracolic gutter. No definite pancreatic gland necrosis though there are scattered areas of hypodensity noted within that may reflect areas of interdigitating fluid and the edema. No pseudocysts. 2. Hepatic steatosis. 3. Tiny gallstone or polyp along the dependent wall of the gallbladder. Electronically Signed   By: DAshley RoyaltyM.D.   On: 12/26/2017 23:33    DOrson Eva DO  [facility] Pager [phone]  If 7PM-7AM, please contact night-coverage [email or web address] Password [email or web address] 12/27/2017, 6:41 AM   LOS: 0 days

## 2017-12-28 DIAGNOSIS — K859 Acute pancreatitis without necrosis or infection, unspecified: Secondary | ICD-10-CM | POA: Diagnosis not present

## 2017-12-28 DIAGNOSIS — F10988 Alcohol use, unspecified with other alcohol-induced disorder: Secondary | ICD-10-CM

## 2017-12-28 DIAGNOSIS — E876 Hypokalemia: Secondary | ICD-10-CM

## 2017-12-28 DIAGNOSIS — K852 Alcohol induced acute pancreatitis without necrosis or infection: Secondary | ICD-10-CM | POA: Diagnosis not present

## 2017-12-28 DIAGNOSIS — F1029 Alcohol dependence with unspecified alcohol-induced disorder: Secondary | ICD-10-CM

## 2017-12-28 DIAGNOSIS — R7302 Impaired glucose tolerance (oral): Secondary | ICD-10-CM | POA: Diagnosis not present

## 2017-12-28 DIAGNOSIS — I10 Essential (primary) hypertension: Secondary | ICD-10-CM

## 2017-12-28 HISTORY — DX: Impaired glucose tolerance (oral): R73.02

## 2017-12-28 LAB — COMPREHENSIVE METABOLIC PANEL
ALK PHOS: 49 U/L (ref 38–126)
ALT: 18 U/L (ref 17–63)
ANION GAP: 13 (ref 5–15)
AST: 20 U/L (ref 15–41)
Albumin: 3.5 g/dL (ref 3.5–5.0)
BUN: 5 mg/dL — ABNORMAL LOW (ref 6–20)
CALCIUM: 8.1 mg/dL — AB (ref 8.9–10.3)
CO2: 23 mmol/L (ref 22–32)
CREATININE: 0.72 mg/dL (ref 0.61–1.24)
Chloride: 98 mmol/L — ABNORMAL LOW (ref 101–111)
GFR calc Af Amer: >60 mL/min (ref >60)
GFR calc non Af Amer: >60 mL/min (ref >60)
Glucose, Bld: 121 mg/dL — ABNORMAL HIGH (ref 65–99)
Potassium: 3.3 mmol/L — ABNORMAL LOW (ref 3.5–5.1)
SODIUM: 134 mmol/L — AB (ref 135–145)
Total Bilirubin: 1 mg/dL (ref 0.3–1.2)
Total Protein: 7.1 g/dL (ref 6.5–8.1)

## 2017-12-28 LAB — CBC
HCT: 35.2 % — ABNORMAL LOW (ref 39.0–52.0)
HEMOGLOBIN: 11.7 g/dL — AB (ref 13.0–17.0)
MCH: 32.4 pg (ref 26.0–34.0)
MCHC: 33.2 g/dL (ref 30.0–36.0)
MCV: 97.5 fL (ref 78.0–100.0)
Platelets: 178 10*3/uL (ref 150–400)
RBC: 3.61 MIL/uL — ABNORMAL LOW (ref 4.22–5.81)
RDW: 14.2 % (ref 11.5–15.5)
WBC: 10.1 10*3/uL (ref 4.0–10.5)

## 2017-12-28 LAB — HIV ANTIBODY (ROUTINE TESTING W REFLEX): HIV Screen 4th Generation wRfx: NONREACTIVE

## 2017-12-28 MED ORDER — HYDRALAZINE HCL 20 MG/ML IJ SOLN: 10.0000 mg | Freq: Four times a day (QID) | INTRAMUSCULAR | Status: DC | PRN

## 2017-12-28 MED ORDER — OXYCODONE-ACETAMINOPHEN 5-325 MG PO TABS: 2.0000 | ORAL_TABLET | ORAL | Status: DC | PRN

## 2017-12-28 MED ORDER — POTASSIUM CHLORIDE CRYS ER 20 MEQ PO TBCR
20.0000 meq | EXTENDED_RELEASE_TABLET | Freq: Once | ORAL | Status: AC
  Administered 2017-12-28: 20 meq via ORAL
  Filled 2017-12-28: qty 1

## 2017-12-28 MED ORDER — POTASSIUM CHLORIDE IN NACL 20-0.9 MEQ/L-% IV SOLN
INTRAVENOUS | Status: DC
  Administered 2017-12-28 – 2017-12-29 (×4): via INTRAVENOUS

## 2017-12-28 MED ORDER — ADULT MULTIVITAMIN W/MINERALS CH
1.0000 | ORAL_TABLET | Freq: Every day | ORAL | Status: DC
  Administered 2017-12-28 – 2017-12-29 (×2): 1 via ORAL
  Filled 2017-12-28 (×2): qty 1

## 2017-12-28 MED ORDER — LORAZEPAM 1 MG PO TABS: 1.0000 mg | ORAL_TABLET | Freq: Four times a day (QID) | ORAL | Status: DC | PRN

## 2017-12-28 MED ORDER — THIAMINE HCL 100 MG/ML IJ SOLN: 100.0000 mg | Freq: Every day | INTRAMUSCULAR | Status: DC

## 2017-12-28 MED ORDER — VITAMIN B-1 100 MG PO TABS
100.0000 mg | ORAL_TABLET | Freq: Every day | ORAL | Status: DC
  Administered 2017-12-28 – 2017-12-29 (×2): 100 mg via ORAL
  Filled 2017-12-28 (×2): qty 1

## 2017-12-28 MED ORDER — OXYCODONE-ACETAMINOPHEN 5-325 MG PO TABS
1.0000 | ORAL_TABLET | ORAL | Status: DC | PRN
  Administered 2017-12-28 (×3): 2 via ORAL
  Filled 2017-12-28 (×3): qty 2

## 2017-12-28 MED ORDER — AMLODIPINE BESYLATE 5 MG PO TABS
10.0000 mg | ORAL_TABLET | ORAL | Status: DC
  Administered 2017-12-28: 10 mg via ORAL
  Filled 2017-12-28: qty 2

## 2017-12-28 MED ORDER — FOLIC ACID 1 MG PO TABS
1.0000 mg | ORAL_TABLET | Freq: Every day | ORAL | Status: DC
  Administered 2017-12-28 – 2017-12-29 (×2): 1 mg via ORAL
  Filled 2017-12-28 (×2): qty 1

## 2017-12-28 MED ORDER — LORAZEPAM 2 MG/ML IJ SOLN: 1.0000 mg | Freq: Four times a day (QID) | INTRAMUSCULAR | Status: DC | PRN

## 2017-12-28 NOTE — Progress Notes (Addendum)
PROGRESS NOTE  Matthew Moody YEM:[id number] DOB: Mar 15, 1971 DOA: 12/26/2017 PCP: Patient, No Pcp Per   Brief History:  47 year old male with no listed chronic medical problems presented with 3-4-day history of nausea, vomiting, and abdominal pain.  Patient states that he had a "New Year's party"and began drinking on 12/21/2017.  He was not able to quantify the amount of alcohol, but states that he had a number of mixed drinks, shots of liquor, and beer.  In addition, the patient states that he drinks 2 beers and mixed drinks approximately 3 days out of the week.  He denies any new medications.  He denies any illegal drug use.  He presented to emergency department on 12/26/2017 the patient was treated symptomatically and subsequently released home.. At home, the patient ate some soup and began having worsening abdominal pain and vomiting again.  As result, he return for further evaluation.  In the emergency department, the patient was afebrile hemodynamically stable.  AST 40, ALT 32, alk phosphatase 54, total bilirubin 1.5, lipase 359.  The patient was admitted for treatment of pancreatitis.  Assessment/Plan: Acute pancreatitis -Alcohol related -urine drug screen--positive opiates -Right upper quadrant ultrasound--GB sludge without ductal dilatation or cholecystitis -Lipid panel--LDL 86 and Trig 151 -HIV -continue IVF--add KCl -pain control/antiemetics -advance diet to full liquids  Essential hypertension -Patient states that he was given some type of antihypertensive medication over 5 years ago--he only took for 1 month, then he was lost for follow-up and has not been on any medication since then -Increase amlodipine  Impaired glucose tolerance -Hemoglobin A1c--5.7 -discussed lifestyle modification  Alcohol dependence -Cessation of alcohol discussed -CIWA protocol  Hypokalemia -replete -check mag  Hypertriglyceridemia -lifestyle modification for  now    Disposition Plan:   Home 1/7 if stable Family Communication:   No Family at bedside--Total time spent 35 minutes.  Greater than 50% spent face to face counseling and coordinating care.   Consultants:  none  Code Status:  FULL   DVT Prophylaxis:   / [place] Lovenox   Procedures: As Listed in Progress Note Above  Antibiotics: None      Subjective: Patient states that abdominal pain is improving.  He denies any nausea or vomiting or diarrhea.  Denies any fevers, chills, chest pain or shortness of breath, headache, neck pain, dysuria, hematuria.  Objective: Vitals:   12/27/17 0400 12/27/17 1420 12/27/17 2133 12/28/17 0505  BP: (!) 102/94 (!) 168/96 (!) 159/96 (!) 153/94  Pulse: 79 96 (!) 109 (!) 101  Resp:  '19 19 18  ' Temp:  98.7 F (37.1 C) 99.6 F (37.6 C) 99.6 F (37.6 C)  TempSrc:  Oral Oral Oral  SpO2:  97% 100% 95%  Weight:      Height:        Intake/Output Summary (Last 24 hours) at 12/28/2017 0737 Last data filed at 12/28/2017 0600 Gross per 24 hour  Intake 2300 ml  Output -  Net 2300 ml   Weight change:  Exam:   General:  Pt is alert, follows commands appropriately, not in acute distress  HEENT: No icterus, No thrush, No neck mass, [place]/AT  Cardiovascular: RRR, S1/S2, no rubs, no gallops  Respiratory: bibasilar crackles, R>L, no wheeze  Abdomen: Soft/+BS, mild epigastric tender, non distended, no guarding  Extremities: No edema, No lymphangitis, No petechiae, No rashes, no synovitis   Data Reviewed: I have personally reviewed following labs and imaging studies Basic Metabolic Panel: Recent Labs  Lab 12/25/17 2203 12/26/17 1637 12/27/17 0541  NA 138 137 133*  K 3.3* 3.5 3.5  CL 99* 98* 100*  CO2 '23 25 23  ' GLUCOSE 126* 163* 139*  BUN 9 6 <5*  CREATININE 0.95 0.85 0.72  CALCIUM 9.1 8.9 7.9*   Liver Function Tests: Recent Labs  Lab 12/25/17 2203 12/26/17 1637 12/27/17 0541  AST 64* 40 27  ALT 40 32 24  ALKPHOS 47 54 43   BILITOT 1.3* 1.5* 1.0  PROT 8.0 8.0 6.7  ALBUMIN 4.7 4.7 3.8   Recent Labs  Lab 12/25/17 2203 12/26/17 1637  LIPASE 135* 359*   No results for input(s): AMMONIA in the last 168 hours. Coagulation Profile: No results for input(s): INR, PROTIME in the last 168 hours. CBC: Recent Labs  Lab 12/25/17 2203 12/26/17 1637  WBC 8.6 10.4  HGB 13.7 13.7  HCT 39.7 40.7  MCV 95.4 96.7  PLT 254 233   Cardiac Enzymes: No results for input(s): CKTOTAL, CKMB, CKMBINDEX, TROPONINI in the last 168 hours. BNP: Invalid input(s): POCBNP CBG: No results for input(s): GLUCAP in the last 168 hours. HbA1C: Recent Labs    12/27/17 0541  HGBA1C 5.7*   Urine analysis:    Component Value Date/Time   COLORURINE YELLOW 12/26/2017 2000   APPEARANCEUR HAZY (A) 12/26/2017 2000   LABSPEC 1.020 12/26/2017 2000   PHURINE 5.0 12/26/2017 2000   GLUCOSEU 150 (A) 12/26/2017 2000   HGBUR SMALL (A) 12/26/2017 2000   BILIRUBINUR NEGATIVE 12/26/2017 2000   KETONESUR NEGATIVE 12/26/2017 2000   PROTEINUR 30 (A) 12/26/2017 2000   UROBILINOGEN 0.2 06/26/2015 1551   NITRITE NEGATIVE 12/26/2017 2000   LEUKOCYTESUR NEGATIVE 12/26/2017 2000   Sepsis Labs: '@LABRCNTIP' (procalcitonin:4,lacticidven:4) )No results found for this or any previous visit (from the past 240 hour(s)).   Scheduled Meds: . amLODipine  10 mg Oral Q24H  . enoxaparin (LOVENOX) injection  40 mg Subcutaneous Q24H   Continuous Infusions: . 0.9 % NaCl with KCl 20 mEq / L      Procedures/Studies: Ct Abdomen Pelvis W Contrast  Result Date: 12/26/2017 CLINICAL DATA:  Abdominal pain and emesis since last evening. EXAM: CT ABDOMEN AND PELVIS WITH CONTRAST TECHNIQUE: Multidetector CT imaging of the abdomen and pelvis was performed using the standard protocol following bolus administration of intravenous contrast. CONTRAST:  139m ISOVUE-300 IOPAMIDOL (ISOVUE-300) INJECTION 61% COMPARISON:  Radiographs from 08/20/2013 FINDINGS: Lower chest:  Dependent atelectasis. Top normal size heart without pericardial effusion. Hepatobiliary: Mild hepatic steatosis. Tiny gallstone or polyp along the dependent wall of the gallbladder, series 2, image 37. No secondary signs of acute cholecystitis. No biliary dilatation. Pancreas: Edematous appearance of the pancreas with peripancreatic edema and inflammation consistent with acute pancreatitis. No pancreatic gland necrosis or definite pseudocyst formation. No ductal dilatation. Spleen: Normal size spleen without mass. Adrenals/Urinary Tract: Normal bilateral adrenal glands. Symmetric enhancement of both kidneys without obstructive uropathy. Normal bladder. Stomach/Bowel: Decompressed stomach. Normal small bowel rotation without obstruction. Appendix is unremarkable adjacent to the right hepatic lobe. No large bowel inflammation or obstruction. Vascular/Lymphatic: No significant vascular findings are present. No enlarged abdominal or pelvic lymph nodes. Reproductive: Prostate and seminal vesicles are unremarkable. Other: Trace fluid tracks along the right pericolic gutter into the right hemipelvis. Musculoskeletal: Lumbosacral transitional vertebral anatomy with degenerative disc disease at L5-S1. IMPRESSION: 1. Acute pancreatitis with moderate peripancreatic edema and inflammation with tracking of fluid into the pelvis along the right paracolic gutter. No definite pancreatic gland necrosis though there are scattered areas  of hypodensity noted within that may reflect areas of interdigitating fluid and the edema. No pseudocysts. 2. Hepatic steatosis. 3. Tiny gallstone or polyp along the dependent wall of the gallbladder. Electronically Signed   By: Ashley Royalty M.D.   On: 12/26/2017 23:33   US Abdomen Limited Ruq  Result Date: 12/27/2017 CLINICAL DATA:  Possible polyp or stone within the gallbladder. History of pancreatitis. EXAM: ULTRASOUND ABDOMEN LIMITED RIGHT UPPER QUADRANT COMPARISON:  CT abdomen pelvis  12/26/2017. FINDINGS: Gallbladder: Sludge within the gallbladder lumen. No gallbladder wall thickening or pericholecystic fluid. Negative sonographic Murphy sign. Common bile duct: Diameter: 5 mm Liver: Liver is diffusely increased in echogenicity. No focal lesion identified. Portal vein is patent on color Doppler imaging with normal direction of blood flow towards the liver. IMPRESSION: Sludge within the gallbladder lumen. No secondary signs to suggest acute cholecystitis. Hepatic steatosis. Electronically Signed   By: Lovey Newcomer M.D.   On: 12/27/2017 10:00    Orson Eva, DO  [facility] Pager [phone]  If 7PM-7AM, please contact night-coverage [email or web address] Password [email or web address] 12/28/2017, 7:37 AM   LOS: 0 days

## 2017-12-28 NOTE — Discharge Summary (Signed)
Physician Discharge Summary  Matthew Moody:[id number] DOB: 1971/06/16 DOA: 12/26/2017  PCP: Patient, No Pcp Per  Admit date: 12/26/2017 Discharge date: 12/29/2017  Admitted From: Home Disposition:  Home   Recommendations for Outpatient Follow-up:  1. Follow up with PCP in 1-2 weeks 2. Please obtain BMP/CBC in one week     Discharge Condition: Stable CODE STATUS: FULL Diet recommendation: Heart Healthy   Brief/Interim Summary: 47 year old male with nolistedchronic medical problems presented with 3-4-day history of nausea, vomiting, and abdominal pain. Patient states that he had a "New Year's party"and began drinking on 12/21/2017. He was not able to quantify the amount of alcohol, but states that he had a number of mixed drinks, shots of liquor, and beer. In addition, the patient states that he drinks 2 beers and mixed drinks approximately 3 days out of the week. He denies any new medications. He denies any illegal drug use. He presented to emergency department on 12/26/2017 the patient was treated symptomatically and subsequently released home..At home, the patient ate some soup and began having worsening abdominal pain and vomiting again. As result, he return for further evaluation. In the emergency department, the patient was afebrile hemodynamically stable. AST 40, ALT 32, alk phosphatase 54, total bilirubin 1.5, lipase 359. The patient was admitted for treatment of pancreatitis.    Discharge Diagnoses:  Acute pancreatitis -Alcohol related -urine drug screen--positive opiates -Right upper quadrant ultrasound--GB sludge without ductal dilatation or cholecystitis -Lipid panel--LDL 86 and Trig 151 -HIV--neg -continue IVF--add KCl -pain control/antiemetics -advance diet to soft diet which pt tolerated  Essential hypertension -Patient states that he was given some type of antihypertensive medication over 5 years ago--he only took for 1 month, then he was lost for  follow-up and has not been on any medication since then -Increased amlodipine to 10 mg daily  Impaired glucose tolerance -Hemoglobin A1c--5.7 -discussed lifestyle modification  Alcohol dependence -Cessation of alcohol discussed -CIWA protocol--no signs of withdrawal  Hypokalemia -replete -check mag--1.7  Hypertriglyceridemia -lifestyle modification for now       Discharge Instructions  Discharge Instructions    Diet - low sodium heart healthy   Complete by:  As directed    Increase activity slowly   Complete by:  As directed      Allergies as of 12/29/2017   No Known Allergies     Medication List    STOP taking these medications   HYDROcodone-acetaminophen 5-325 MG tablet Commonly known as:  NORCO/VICODIN   ondansetron 4 MG tablet Commonly known as:  ZOFRAN   ondansetron 8 MG disintegrating tablet Commonly known as:  ZOFRAN ODT     TAKE these medications   amLODipine 10 MG tablet Commonly known as:  NORVASC Take 1 tablet (10 mg total) by mouth daily.   multivitamin with minerals Tabs tablet Take 1 tablet by mouth daily.       No Known Allergies  Consultations:  none   Procedures/Studies: Ct Abdomen Pelvis W Contrast  Result Date: 12/26/2017 CLINICAL DATA:  Abdominal pain and emesis since last evening. EXAM: CT ABDOMEN AND PELVIS WITH CONTRAST TECHNIQUE: Multidetector CT imaging of the abdomen and pelvis was performed using the standard protocol following bolus administration of intravenous contrast. CONTRAST:  169m ISOVUE-300 IOPAMIDOL (ISOVUE-300) INJECTION 61% COMPARISON:  Radiographs from 08/20/2013 FINDINGS: Lower chest: Dependent atelectasis. Top normal size heart without pericardial effusion. Hepatobiliary: Mild hepatic steatosis. Tiny gallstone or polyp along the dependent wall of the gallbladder, series 2, image 37. No secondary signs of acute  cholecystitis. No biliary dilatation. Pancreas: Edematous appearance of the pancreas with  peripancreatic edema and inflammation consistent with acute pancreatitis. No pancreatic gland necrosis or definite pseudocyst formation. No ductal dilatation. Spleen: Normal size spleen without mass. Adrenals/Urinary Tract: Normal bilateral adrenal glands. Symmetric enhancement of both kidneys without obstructive uropathy. Normal bladder. Stomach/Bowel: Decompressed stomach. Normal small bowel rotation without obstruction. Appendix is unremarkable adjacent to the right hepatic lobe. No large bowel inflammation or obstruction. Vascular/Lymphatic: No significant vascular findings are present. No enlarged abdominal or pelvic lymph nodes. Reproductive: Prostate and seminal vesicles are unremarkable. Other: Trace fluid tracks along the right pericolic gutter into the right hemipelvis. Musculoskeletal: Lumbosacral transitional vertebral anatomy with degenerative disc disease at L5-S1. IMPRESSION: 1. Acute pancreatitis with moderate peripancreatic edema and inflammation with tracking of fluid into the pelvis along the right paracolic gutter. No definite pancreatic gland necrosis though there are scattered areas of hypodensity noted within that may reflect areas of interdigitating fluid and the edema. No pseudocysts. 2. Hepatic steatosis. 3. Tiny gallstone or polyp along the dependent wall of the gallbladder. Electronically Signed   By: Ashley Royalty M.D.   On: 12/26/2017 23:33   US Abdomen Limited Ruq  Result Date: 12/27/2017 CLINICAL DATA:  Possible polyp or stone within the gallbladder. History of pancreatitis. EXAM: ULTRASOUND ABDOMEN LIMITED RIGHT UPPER QUADRANT COMPARISON:  CT abdomen pelvis 12/26/2017. FINDINGS: Gallbladder: Sludge within the gallbladder lumen. No gallbladder wall thickening or pericholecystic fluid. Negative sonographic Murphy sign. Common bile duct: Diameter: 5 mm Liver: Liver is diffusely increased in echogenicity. No focal lesion identified. Portal vein is patent on color Doppler imaging with  normal direction of blood flow towards the liver. IMPRESSION: Sludge within the gallbladder lumen. No secondary signs to suggest acute cholecystitis. Hepatic steatosis. Electronically Signed   By: Lovey Newcomer M.D.   On: 12/27/2017 10:00        Discharge Exam: Vitals:   12/28/17 2117 12/29/17 0502  BP: (!) 155/90 (!) 145/75  Pulse: 91 86  Resp: 19 19  Temp: 98.4 F (36.9 C) 98.5 F (36.9 C)  SpO2: 99% 100%   Vitals:   12/28/17 0505 12/28/17 1400 12/28/17 2117 12/29/17 0502  BP: (!) 153/94 (!) 155/103 (!) 155/90 (!) 145/75  Pulse: (!) 101 (!) 112 91 86  Resp: '18 19 19 19  ' Temp: 99.6 F (37.6 C) 98.2 F (36.8 C) 98.4 F (36.9 C) 98.5 F (36.9 C)  TempSrc: Oral Oral Oral Oral  SpO2: 95% 100% 99% 100%  Weight:      Height:        General: Pt is alert, awake, not in acute distress Cardiovascular: RRR, S1/S2 +, no rubs, no gallops Respiratory: CTA bilaterally, no wheezing, no rhonchi Abdominal: Soft, NT, ND, bowel sounds + Extremities: no edema, no cyanosis   The results of significant diagnostics from this hospitalization (including imaging, microbiology, ancillary and laboratory) are listed below for reference.    Significant Diagnostic Studies: Ct Abdomen Pelvis W Contrast  Result Date: 12/26/2017 CLINICAL DATA:  Abdominal pain and emesis since last evening. EXAM: CT ABDOMEN AND PELVIS WITH CONTRAST TECHNIQUE: Multidetector CT imaging of the abdomen and pelvis was performed using the standard protocol following bolus administration of intravenous contrast. CONTRAST:  137m ISOVUE-300 IOPAMIDOL (ISOVUE-300) INJECTION 61% COMPARISON:  Radiographs from 08/20/2013 FINDINGS: Lower chest: Dependent atelectasis. Top normal size heart without pericardial effusion. Hepatobiliary: Mild hepatic steatosis. Tiny gallstone or polyp along the dependent wall of the gallbladder, series 2, image 37. No secondary signs  of acute cholecystitis. No biliary dilatation. Pancreas: Edematous  appearance of the pancreas with peripancreatic edema and inflammation consistent with acute pancreatitis. No pancreatic gland necrosis or definite pseudocyst formation. No ductal dilatation. Spleen: Normal size spleen without mass. Adrenals/Urinary Tract: Normal bilateral adrenal glands. Symmetric enhancement of both kidneys without obstructive uropathy. Normal bladder. Stomach/Bowel: Decompressed stomach. Normal small bowel rotation without obstruction. Appendix is unremarkable adjacent to the right hepatic lobe. No large bowel inflammation or obstruction. Vascular/Lymphatic: No significant vascular findings are present. No enlarged abdominal or pelvic lymph nodes. Reproductive: Prostate and seminal vesicles are unremarkable. Other: Trace fluid tracks along the right pericolic gutter into the right hemipelvis. Musculoskeletal: Lumbosacral transitional vertebral anatomy with degenerative disc disease at L5-S1. IMPRESSION: 1. Acute pancreatitis with moderate peripancreatic edema and inflammation with tracking of fluid into the pelvis along the right paracolic gutter. No definite pancreatic gland necrosis though there are scattered areas of hypodensity noted within that may reflect areas of interdigitating fluid and the edema. No pseudocysts. 2. Hepatic steatosis. 3. Tiny gallstone or polyp along the dependent wall of the gallbladder. Electronically Signed   By: Ashley Royalty M.D.   On: 12/26/2017 23:33   US Abdomen Limited Ruq  Result Date: 12/27/2017 CLINICAL DATA:  Possible polyp or stone within the gallbladder. History of pancreatitis. EXAM: ULTRASOUND ABDOMEN LIMITED RIGHT UPPER QUADRANT COMPARISON:  CT abdomen pelvis 12/26/2017. FINDINGS: Gallbladder: Sludge within the gallbladder lumen. No gallbladder wall thickening or pericholecystic fluid. Negative sonographic Murphy sign. Common bile duct: Diameter: 5 mm Liver: Liver is diffusely increased in echogenicity. No focal lesion identified. Portal vein is patent  on color Doppler imaging with normal direction of blood flow towards the liver. IMPRESSION: Sludge within the gallbladder lumen. No secondary signs to suggest acute cholecystitis. Hepatic steatosis. Electronically Signed   By: Lovey Newcomer M.D.   On: 12/27/2017 10:00     Microbiology: No results found for this or any previous visit (from the past 240 hour(s)).   Labs: Basic Metabolic Panel: Recent Labs  Lab 12/25/17 2203 12/26/17 1637 12/27/17 0541 12/28/17 0602 12/29/17 0533  NA 138 137 133* 134* 137  K 3.3* 3.5 3.5 3.3* 3.7  CL 99* 98* 100* 98* 102  CO2 '23 25 23 23 24  ' GLUCOSE 126* 163* 139* 121* 108*  BUN 9 6 <5* 5* 5*  CREATININE 0.95 0.85 0.72 0.72 0.60*  CALCIUM 9.1 8.9 7.9* 8.1* 8.4*  MG  --   --   --   --  1.7   Liver Function Tests: Recent Labs  Lab 12/25/17 2203 12/26/17 1637 12/27/17 0541 12/28/17 0602  AST 64* 40 27 20  ALT 40 32 24 18  ALKPHOS 47 54 43 49  BILITOT 1.3* 1.5* 1.0 1.0  PROT 8.0 8.0 6.7 7.1  ALBUMIN 4.7 4.7 3.8 3.5   Recent Labs  Lab 12/25/17 2203 12/26/17 1637  LIPASE 135* 359*   No results for input(s): AMMONIA in the last 168 hours. CBC: Recent Labs  Lab 12/25/17 2203 12/26/17 1637 12/28/17 0602 12/29/17 0533  WBC 8.6 10.4 10.1 7.8  HGB 13.7 13.7 11.7* 11.5*  HCT 39.7 40.7 35.2* 33.6*  MCV 95.4 96.7 97.5 98.2  PLT 254 233 178 177   Cardiac Enzymes: No results for input(s): CKTOTAL, CKMB, CKMBINDEX, TROPONINI in the last 168 hours. BNP: Invalid input(s): POCBNP CBG: No results for input(s): GLUCAP in the last 168 hours.  Time coordinating discharge:  Greater than 30 minutes  Signed:  Orson Eva, DO [facility]  Pager: [phone] 12/29/2017, 11:43 AM

## 2017-12-29 DIAGNOSIS — K852 Alcohol induced acute pancreatitis without necrosis or infection: Secondary | ICD-10-CM | POA: Diagnosis not present

## 2017-12-29 DIAGNOSIS — I10 Essential (primary) hypertension: Secondary | ICD-10-CM | POA: Diagnosis not present

## 2017-12-29 DIAGNOSIS — E876 Hypokalemia: Secondary | ICD-10-CM | POA: Diagnosis not present

## 2017-12-29 DIAGNOSIS — F1020 Alcohol dependence, uncomplicated: Secondary | ICD-10-CM | POA: Diagnosis not present

## 2017-12-29 LAB — CBC
HCT: 33.6 % — ABNORMAL LOW (ref 39.0–52.0)
HEMOGLOBIN: 11.5 g/dL — AB (ref 13.0–17.0)
MCH: 33.6 pg (ref 26.0–34.0)
MCHC: 34.2 g/dL (ref 30.0–36.0)
MCV: 98.2 fL (ref 78.0–100.0)
Platelets: 177 10*3/uL (ref 150–400)
RBC: 3.42 MIL/uL — ABNORMAL LOW (ref 4.22–5.81)
RDW: 14.3 % (ref 11.5–15.5)
WBC: 7.8 10*3/uL (ref 4.0–10.5)

## 2017-12-29 LAB — BASIC METABOLIC PANEL
ANION GAP: 11 (ref 5–15)
BUN: 5 mg/dL — ABNORMAL LOW (ref 6–20)
CALCIUM: 8.4 mg/dL — AB (ref 8.9–10.3)
CHLORIDE: 102 mmol/L (ref 101–111)
CO2: 24 mmol/L (ref 22–32)
CREATININE: 0.6 mg/dL — AB (ref 0.61–1.24)
GFR calc Af Amer: >60 mL/min (ref >60)
GFR calc non Af Amer: >60 mL/min (ref >60)
Glucose, Bld: 108 mg/dL — ABNORMAL HIGH (ref 65–99)
Potassium: 3.7 mmol/L (ref 3.5–5.1)
SODIUM: 137 mmol/L (ref 135–145)

## 2017-12-29 LAB — MAGNESIUM: Magnesium: 1.7 mg/dL (ref 1.7–2.4)

## 2017-12-29 MED ORDER — AMLODIPINE BESYLATE 10 MG PO TABS: 10.0000 mg | ORAL_TABLET | ORAL | 1 refills | Status: DC

## 2017-12-29 NOTE — Discharge Instructions (Signed)
1. Follow up with PCP in 1-2 weeks °2. Please obtain BMP/CBC in one week ° ° ° °

## 2017-12-29 NOTE — Progress Notes (Signed)
Discharge instructions gone over with patient and spouse, verbalized understanding. Patient requested note for work, MD was text paged and note was given to patient. Patient understands that he is to follow up with his primary care physician within 2 weeks. IV removed, patient tolerated procedure well.

## 2018-05-01 ENCOUNTER — Encounter (HOSPITAL_COMMUNITY): Payer: Self-pay

## 2018-05-01 ENCOUNTER — Other Ambulatory Visit: Payer: Self-pay

## 2018-05-01 ENCOUNTER — Emergency Department (HOSPITAL_COMMUNITY)
Admission: EM | Admit: 2018-05-01 | Discharge: 2018-05-01 | Disposition: A | Discharge status: Home / Self Care | Payer: Managed Care, Other (non HMO) | Attending: Emergency Medicine | Admitting: Emergency Medicine

## 2018-05-01 DIAGNOSIS — R1084 Generalized abdominal pain: Secondary | ICD-10-CM | POA: Diagnosis present

## 2018-05-01 DIAGNOSIS — K852 Alcohol induced acute pancreatitis without necrosis or infection: Secondary | ICD-10-CM | POA: Diagnosis not present

## 2018-05-01 DIAGNOSIS — Z79899 Other long term (current) drug therapy: Secondary | ICD-10-CM | POA: Insufficient documentation

## 2018-05-01 DIAGNOSIS — R112 Nausea with vomiting, unspecified: Secondary | ICD-10-CM | POA: Insufficient documentation

## 2018-05-01 DIAGNOSIS — I10 Essential (primary) hypertension: Secondary | ICD-10-CM | POA: Diagnosis not present

## 2018-05-01 DIAGNOSIS — R197 Diarrhea, unspecified: Secondary | ICD-10-CM | POA: Insufficient documentation

## 2018-05-01 LAB — URINALYSIS, ROUTINE W REFLEX MICROSCOPIC
BILIRUBIN URINE: NEGATIVE
GLUCOSE, UA: NEGATIVE mg/dL
Hgb urine dipstick: NEGATIVE
Ketones, ur: NEGATIVE mg/dL
Leukocytes, UA: NEGATIVE
NITRITE: NEGATIVE
PH: 5 (ref 5.0–8.0)
Protein, ur: 30 mg/dL — AB
SPECIFIC GRAVITY, URINE: 1.026 (ref 1.005–1.030)

## 2018-05-01 LAB — COMPREHENSIVE METABOLIC PANEL
ALBUMIN: 4.5 g/dL (ref 3.5–5.0)
ALT: 29 U/L (ref 17–63)
AST: 31 U/L (ref 15–41)
Alkaline Phosphatase: 53 U/L (ref 38–126)
Anion gap: 13 (ref 5–15)
BUN: 8 mg/dL (ref 6–20)
CALCIUM: 9.4 mg/dL (ref 8.9–10.3)
CHLORIDE: 96 mmol/L — AB (ref 101–111)
CO2: 25 mmol/L (ref 22–32)
Creatinine, Ser: 1 mg/dL (ref 0.61–1.24)
GFR calc Af Amer: >60 mL/min (ref >60)
GFR calc non Af Amer: >60 mL/min (ref >60)
GLUCOSE: 147 mg/dL — AB (ref 65–99)
POTASSIUM: 3.4 mmol/L — AB (ref 3.5–5.1)
Sodium: 134 mmol/L — ABNORMAL LOW (ref 135–145)
TOTAL PROTEIN: 7.7 g/dL (ref 6.5–8.1)
Total Bilirubin: 1.3 mg/dL — ABNORMAL HIGH (ref 0.3–1.2)

## 2018-05-01 LAB — CBC
HCT: 44.6 % (ref 39.0–52.0)
HEMOGLOBIN: 15.2 g/dL (ref 13.0–17.0)
MCH: 31 pg (ref 26.0–34.0)
MCHC: 34.1 g/dL (ref 30.0–36.0)
MCV: 90.8 fL (ref 78.0–100.0)
PLATELETS: 251 10*3/uL (ref 150–400)
RBC: 4.91 MIL/uL (ref 4.22–5.81)
RDW: 13 % (ref 11.5–15.5)
WBC: 9.8 10*3/uL (ref 4.0–10.5)

## 2018-05-01 LAB — LIPASE, BLOOD: LIPASE: 210 U/L — AB (ref 11–51)

## 2018-05-01 MED ORDER — MORPHINE SULFATE (PF) 4 MG/ML IV SOLN
4.0000 mg | Freq: Once | INTRAVENOUS | Status: AC
  Administered 2018-05-01: 4 mg via INTRAVENOUS
  Filled 2018-05-01: qty 1

## 2018-05-01 MED ORDER — AMLODIPINE BESYLATE 5 MG PO TABS
10.0000 mg | ORAL_TABLET | Freq: Once | ORAL | Status: AC
  Administered 2018-05-01: 10 mg via ORAL
  Filled 2018-05-01: qty 2

## 2018-05-01 MED ORDER — ONDANSETRON HCL 4 MG/2ML IJ SOLN
4.0000 mg | Freq: Once | INTRAMUSCULAR | Status: AC
  Administered 2018-05-01: 4 mg via INTRAVENOUS
  Filled 2018-05-01: qty 2

## 2018-05-01 MED ORDER — HYDROCODONE-ACETAMINOPHEN 5-325 MG PO TABS: 1.0000 | ORAL_TABLET | Freq: Four times a day (QID) | ORAL | 0 refills | Status: DC | PRN

## 2018-05-01 MED ORDER — SODIUM CHLORIDE 0.9 % IV BOLUS
1000.0000 mL | Freq: Once | INTRAVENOUS | Status: AC
  Administered 2018-05-01: 1000 mL via INTRAVENOUS

## 2018-05-01 MED ORDER — ONDANSETRON 8 MG PO TBDP: 8.0000 mg | ORAL_TABLET | Freq: Three times a day (TID) | ORAL | 0 refills | Status: DC | PRN

## 2018-05-01 NOTE — ED Notes (Signed)
Pt has not had BP meds filled

## 2018-05-01 NOTE — ED Triage Notes (Signed)
Pt reports generalized abd pain that started yesterday. Pt reports vomiting and diarrhea today. Pt reports he is able now to keep down fluids. Reports feeling weak.

## 2018-05-01 NOTE — Discharge Instructions (Signed)
Take the medications for pain and nausea as needed.  Make sure to stay hydrated.  For the next 24 hours, have a very mild diet including soups and broths.  Return to the emergency room as needed for any worsening symptoms.  Avoid any alcohol.

## 2018-05-01 NOTE — ED Provider Notes (Signed)
[facility] EMERGENCY DEPARTMENT Provider Note   CSN: [id number] Arrival date & time: 05/01/18  1914     History   Chief Complaint Chief Complaint  Patient presents with  . Abdominal Pain    HPI Matthew Moody is a 47 y.o. male.  The history is provided by the patient.  Abdominal Pain   This is a new problem. The current episode started yesterday. The problem occurs constantly. The problem has not changed since onset.The pain is located in the generalized abdominal region. The pain is moderate. Associated symptoms include diarrhea, nausea and vomiting. Pertinent negatives include anorexia and fever. Nothing aggravates the symptoms. Nothing relieves the symptoms.  Pt has history of pancreatitis.  He drinks alochol occasionally.  Last was a couple of days ago.    Past Medical History:  Diagnosis Date  . Pancreatitis 2007    Patient Active Problem List   Diagnosis Date Noted  . Impaired glucose tolerance 12/28/2017  . Hypokalemia 12/28/2017  . Essential hypertension   . Pancreatitis, recurrent 12/27/2017  . Accelerated hypertension 12/27/2017  . Acute pancreatitis 12/27/2017  . Acute alcoholic pancreatitis 12/27/2017  . Alcohol dependence (HCC) 12/27/2017  . Unspecified essential hypertension 12/27/2017  . Hyperglycemia 12/27/2017    History reviewed. No pertinent surgical history.      Home Medications    Prior to Admission medications   Medication Sig Start Date End Date Taking? Authorizing Provider  Multiple Vitamin (MULTIVITAMIN WITH MINERALS) TABS tablet Take 1 tablet by mouth daily.   Yes [provider]  sodium-potassium bicarbonate (ALKA-SELTZER GOLD) TBEF dissolvable tablet Take 1 tablet by mouth daily as needed.   Yes [provider]  amLODipine (NORVASC) 10 MG tablet Take 1 tablet (10 mg total) by mouth daily. 12/29/17   Catarina Hartshorn, MD  HYDROcodone-acetaminophen (NORCO/VICODIN) 5-325 MG tablet Take 1 tablet by mouth every 6 (six) hours as  needed. 05/01/18   Linwood Dibbles, MD  ondansetron (ZOFRAN ODT) 8 MG disintegrating tablet Take 1 tablet (8 mg total) by mouth every 8 (eight) hours as needed for nausea or vomiting. 05/01/18   Linwood Dibbles, MD    Family History Family History  Problem Relation Age of Onset  . Hypertension Mother     Social History Social History   Tobacco Use  . Smoking status: Never Smoker  . Smokeless tobacco: Never Used  Substance Use Topics  . Alcohol use: Yes    Alcohol/week: 1.2 oz    Types: 1 Cans of beer, 1 Shots of liquor per week    Comment: soc  . Drug use: No     Allergies   Patient has no known allergies.   Review of Systems Review of Systems  Constitutional: Negative for fever.  Gastrointestinal: Positive for abdominal pain, diarrhea, nausea and vomiting. Negative for anorexia.  All other systems reviewed and are negative.    Physical Exam Updated Vital Signs BP (!) 171/114   Pulse 78   Temp 98.5 F (36.9 C) (Oral)   Resp 18   Ht 1.727 m ( )   Wt 92.1 kg (203 lb)   SpO2 97%   BMI 30.87 kg/m   Physical Exam  Constitutional: He appears well-developed and well-nourished. No distress.  HENT:  Head: Normocephalic and atraumatic.  Right Ear: External ear normal.  Left Ear: External ear normal.  Eyes: Conjunctivae are normal. Right eye exhibits no discharge. Left eye exhibits no discharge. No scleral icterus.  Neck: Neck supple. No tracheal deviation present.  Cardiovascular:  Normal rate, regular rhythm and intact distal pulses.  Pulmonary/Chest: Effort normal and breath sounds normal. No stridor. No respiratory distress. He has no wheezes. He has no rales.  Abdominal: Soft. Bowel sounds are normal. He exhibits no distension. There is tenderness ( mild) in the epigastric area. There is no rebound and no guarding.  Musculoskeletal: He exhibits no edema or tenderness.  Neurological: He is alert. He has normal strength. No sensory deficit. Cranial nerve deficit: no gross  deficits. He exhibits normal muscle tone. He displays no seizure activity. Coordination normal.  Skin: Skin is warm and dry. No rash noted.  Psychiatric: He has a normal mood and affect.  Nursing note and vitals reviewed.    ED Treatments / Results  Labs (all labs ordered are listed, but only abnormal results are displayed) Labs Reviewed  LIPASE, BLOOD - Abnormal; Notable for the following components:      Result Value   Lipase 210 (*)    All other components within normal limits  COMPREHENSIVE METABOLIC PANEL - Abnormal; Notable for the following components:   Sodium 134 (*)    Potassium 3.4 (*)    Chloride 96 (*)    Glucose, Bld 147 (*)    Total Bilirubin 1.3 (*)    All other components within normal limits  URINALYSIS, ROUTINE W REFLEX MICROSCOPIC - Abnormal; Notable for the following components:   Color, Urine AMBER (*)    APPearance HAZY (*)    Protein, ur 30 (*)    Bacteria, UA RARE (*)    All other components within normal limits  CBC      Radiology No results found.  Procedures Procedures (including critical care time)  Medications Ordered in ED Medications  amLODipine (NORVASC) tablet 10 mg (has no administration in time range)  sodium chloride 0.9 % bolus 1,000 mL (0 mLs Intravenous Stopped 05/01/18 2150)  ondansetron (ZOFRAN) injection 4 mg (4 mg Intravenous Given 05/01/18 2044)  morphine 4 MG/ML injection 4 mg (4 mg Intravenous Given 05/01/18 2044)     Initial Impression / Assessment and Plan / ED Course  I have reviewed the triage vital signs and the nursing notes.  Pertinent labs & imaging results that were available during my care of the patient were reviewed by me and considered in my medical decision making (see chart for details).  Clinical Course as of May 01 2152  Fri May 01, 2018  2151 Reviewed [employer] database.  Last opiate rx jan 2019   [JK]    Clinical Course User Index [JK] Linwood Dibbles, MD    Patient presents the emergency room with upper  abdominal pain.  he has a history of pancreatitis.  Patient admits to recent alcohol consumption.  Laboratory tests are consistent with pancreatitis.  Patient improved with IV fluids and pain medications.  His abdominal exam is benign.  I think is reasonable to try outpatient treatment.  I discussed the importance of avoiding alcohol.  Patient will try liquid diet for the next 24 hours.  He understands return to the emergency room for any worsening symptoms.  Final Clinical Impressions(s) / ED Diagnoses   Final diagnoses:  Alcohol-induced acute pancreatitis, unspecified complication status  Hypertension, unspecified type    ED Discharge Orders        Ordered    HYDROcodone-acetaminophen (NORCO/VICODIN) 5-325 MG tablet  Every 6 hours PRN     05/01/18 2152    ondansetron (ZOFRAN ODT) 8 MG disintegrating tablet  Every 8 hours PRN  05/01/18 2152       Linwood Dibbles, MD 05/01/18 2154

## 2018-05-03 ENCOUNTER — Other Ambulatory Visit: Payer: Self-pay

## 2018-05-03 ENCOUNTER — Emergency Department (HOSPITAL_COMMUNITY)
Admission: EM | Admit: 2018-05-03 | Discharge: 2018-05-03 | Disposition: A | Discharge status: Home / Self Care | Payer: Managed Care, Other (non HMO) | Attending: Emergency Medicine | Admitting: Emergency Medicine

## 2018-05-03 ENCOUNTER — Emergency Department (HOSPITAL_COMMUNITY): Payer: Managed Care, Other (non HMO)

## 2018-05-03 ENCOUNTER — Encounter (HOSPITAL_COMMUNITY): Payer: Self-pay | Admitting: Emergency Medicine

## 2018-05-03 DIAGNOSIS — K859 Acute pancreatitis without necrosis or infection, unspecified: Secondary | ICD-10-CM | POA: Insufficient documentation

## 2018-05-03 DIAGNOSIS — R1084 Generalized abdominal pain: Secondary | ICD-10-CM | POA: Diagnosis present

## 2018-05-03 DIAGNOSIS — I10 Essential (primary) hypertension: Secondary | ICD-10-CM | POA: Diagnosis not present

## 2018-05-03 DIAGNOSIS — R1013 Epigastric pain: Secondary | ICD-10-CM

## 2018-05-03 LAB — COMPREHENSIVE METABOLIC PANEL
ALBUMIN: 4.1 g/dL (ref 3.5–5.0)
ALK PHOS: 52 U/L (ref 38–126)
ALT: 19 U/L (ref 17–63)
AST: 24 U/L (ref 15–41)
Anion gap: 14 (ref 5–15)
BUN: 8 mg/dL (ref 6–20)
CALCIUM: 9.3 mg/dL (ref 8.9–10.3)
CO2: 24 mmol/L (ref 22–32)
Chloride: 97 mmol/L — ABNORMAL LOW (ref 101–111)
Creatinine, Ser: 1.05 mg/dL (ref 0.61–1.24)
GFR calc Af Amer: >60 mL/min (ref >60)
GLUCOSE: 157 mg/dL — AB (ref 65–99)
Potassium: 3.1 mmol/L — ABNORMAL LOW (ref 3.5–5.1)
Sodium: 135 mmol/L (ref 135–145)
TOTAL PROTEIN: 8.1 g/dL (ref 6.5–8.1)
Total Bilirubin: 0.9 mg/dL (ref 0.3–1.2)

## 2018-05-03 LAB — CBC WITH DIFFERENTIAL/PLATELET
BASOS ABS: 0 10*3/uL (ref 0.0–0.1)
Basophils Relative: 0 %
EOS ABS: 0.2 10*3/uL (ref 0.0–0.7)
Eosinophils Relative: 3 %
HCT: 41.4 % (ref 39.0–52.0)
Hemoglobin: 14.5 g/dL (ref 13.0–17.0)
LYMPHS PCT: 12 %
Lymphs Abs: 1 10*3/uL (ref 0.7–4.0)
MCH: 31.9 pg (ref 26.0–34.0)
MCHC: 35 g/dL (ref 30.0–36.0)
MCV: 91.2 fL (ref 78.0–100.0)
Monocytes Absolute: 0.3 10*3/uL (ref 0.1–1.0)
Monocytes Relative: 3 %
Neutro Abs: 7.4 10*3/uL (ref 1.7–7.7)
Neutrophils Relative %: 82 %
PLATELETS: 206 10*3/uL (ref 150–400)
RBC: 4.54 MIL/uL (ref 4.22–5.81)
RDW: 12.8 % (ref 11.5–15.5)
WBC: 9 10*3/uL (ref 4.0–10.5)

## 2018-05-03 LAB — URINALYSIS, ROUTINE W REFLEX MICROSCOPIC
BILIRUBIN URINE: NEGATIVE
Bacteria, UA: NONE SEEN
GLUCOSE, UA: NEGATIVE mg/dL
HGB URINE DIPSTICK: NEGATIVE
Ketones, ur: NEGATIVE mg/dL
LEUKOCYTES UA: NEGATIVE
NITRITE: NEGATIVE
PH: 5 (ref 5.0–8.0)
Protein, ur: 30 mg/dL — AB
Specific Gravity, Urine: 1.018 (ref 1.005–1.030)

## 2018-05-03 LAB — LIPASE, BLOOD: LIPASE: 103 U/L — AB (ref 11–51)

## 2018-05-03 LAB — ETHANOL: Alcohol, Ethyl (B): <10 mg/dL (ref <10)

## 2018-05-03 MED ORDER — MORPHINE SULFATE (PF) 4 MG/ML IV SOLN
4.0000 mg | INTRAVENOUS | Status: DC | PRN
  Administered 2018-05-03: 4 mg via INTRAVENOUS
  Filled 2018-05-03: qty 1

## 2018-05-03 MED ORDER — POTASSIUM CHLORIDE CRYS ER 20 MEQ PO TBCR
40.0000 meq | EXTENDED_RELEASE_TABLET | Freq: Once | ORAL | Status: AC
  Administered 2018-05-03: 40 meq via ORAL
  Filled 2018-05-03: qty 2

## 2018-05-03 MED ORDER — ONDANSETRON 4 MG PO TBDP: 4.0000 mg | ORAL_TABLET | Freq: Three times a day (TID) | ORAL | 0 refills | Status: DC | PRN

## 2018-05-03 MED ORDER — FAMOTIDINE IN NACL 20-0.9 MG/50ML-% IV SOLN
20.0000 mg | Freq: Once | INTRAVENOUS | Status: AC
  Administered 2018-05-03: 20 mg via INTRAVENOUS
  Filled 2018-05-03: qty 50

## 2018-05-03 MED ORDER — OXYCODONE-ACETAMINOPHEN 5-325 MG PO TABS: ORAL_TABLET | ORAL | 0 refills | Status: DC

## 2018-05-03 MED ORDER — IOPAMIDOL (ISOVUE-300) INJECTION 61%
100.0000 mL | Freq: Once | INTRAVENOUS | Status: AC | PRN
  Administered 2018-05-03: 100 mL via INTRAVENOUS

## 2018-05-03 MED ORDER — ONDANSETRON HCL 4 MG/2ML IJ SOLN
4.0000 mg | INTRAMUSCULAR | Status: DC | PRN
  Administered 2018-05-03: 4 mg via INTRAVENOUS
  Filled 2018-05-03: qty 2

## 2018-05-03 NOTE — ED Provider Notes (Signed)
[facility] EMERGENCY DEPARTMENT Provider Note   CSN: [id number] Arrival date & time: 05/03/18  1205     History   Chief Complaint Chief Complaint  Patient presents with  . Abdominal Pain    HPI Matthew Moody is a 47 y.o. male.   Abdominal Pain      Pt was seen at 1215.  Per pt, c/o gradual onset and persistence of constant generalized abd "pain" for the past 3 days.  Pt initially had N/V/D, but this has since resolved. Pt states his abd pain continues despite taking the pain meds he received after his ED visit 2 days ago (dx pancreatitis). LD etoh approximately 4 to 5 days ago. Denies any further N/V or diarrhea, no fevers, no back pain, no rash, no CP/SOB, no black or blood in stools or emesis.      Past Medical History:  Diagnosis Date  . Pancreatitis 2007    Patient Active Problem List   Diagnosis Date Noted  . Impaired glucose tolerance 12/28/2017  . Hypokalemia 12/28/2017  . Essential hypertension   . Pancreatitis, recurrent 12/27/2017  . Accelerated hypertension 12/27/2017  . Acute pancreatitis 12/27/2017  . Acute alcoholic pancreatitis 12/27/2017  . Alcohol dependence (HCC) 12/27/2017  . Unspecified essential hypertension 12/27/2017  . Hyperglycemia 12/27/2017    History reviewed. No pertinent surgical history.      Home Medications    Prior to Admission medications   Medication Sig Start Date End Date Taking? Authorizing Provider  HYDROcodone-acetaminophen (NORCO/VICODIN) 5-325 MG tablet Take 1 tablet by mouth every 6 (six) hours as needed. 05/01/18  Yes Linwood Dibbles, MD  Multiple Vitamin (MULTIVITAMIN WITH MINERALS) TABS tablet Take 1 tablet by mouth daily.   Yes [provider]  ondansetron (ZOFRAN ODT) 8 MG disintegrating tablet Take 1 tablet (8 mg total) by mouth every 8 (eight) hours as needed for nausea or vomiting. 05/01/18  Yes Linwood Dibbles, MD  sodium-potassium bicarbonate (ALKA-SELTZER GOLD) TBEF dissolvable tablet Take 1 tablet by mouth  daily as needed.   Yes [provider]  amLODipine (NORVASC) 10 MG tablet Take 1 tablet (10 mg total) by mouth daily. 12/29/17   Catarina Hartshorn, MD    Family History Family History  Problem Relation Age of Onset  . Hypertension Mother     Social History Social History   Tobacco Use  . Smoking status: Never Smoker  . Smokeless tobacco: Never Used  Substance Use Topics  . Alcohol use: Yes    Alcohol/week: 1.2 oz    Types: 1 Cans of beer, 1 Shots of liquor per week    Comment: soc  . Drug use: No     Allergies   Patient has no known allergies.   Review of Systems Review of Systems  Gastrointestinal: Positive for abdominal pain.  ROS: Statement: All systems negative except as marked or noted in the HPI; Constitutional: Negative for fever and chills. ; ; Eyes: Negative for eye pain, redness and discharge. ; ; ENMT: Negative for ear pain, hoarseness, nasal congestion, sinus pressure and sore throat. ; ; Cardiovascular: Negative for chest pain, palpitations, diaphoresis, dyspnea and peripheral edema. ; ; Respiratory: Negative for cough, wheezing and stridor. ; ; Gastrointestinal: +abd pain. Negative for nausea, vomiting, diarrhea, blood in stool, hematemesis, jaundice and rectal bleeding. . ; ; Genitourinary: Negative for dysuria, flank pain and hematuria. ; ; Musculoskeletal: Negative for back pain and neck pain. Negative for swelling and trauma.; ; Skin: Negative for pruritus, rash, abrasions, blisters,  bruising and skin lesion.; ; Neuro: Negative for headache, lightheadedness and neck stiffness. Negative for weakness, altered level of consciousness, altered mental status, extremity weakness, paresthesias, involuntary movement, seizure and syncope.        Physical Exam Updated Vital Signs BP (!) 157/100 (BP Location: Left Arm)   Pulse (!) 105   Temp 98.9 F (37.2 C) (Oral)   Resp 18   Ht  (1.727 m)   Wt 92.1 kg (203 lb)   SpO2 99%   BMI 30.87 kg/m   Physical  Exam 1220: Physical examination:  Nursing notes reviewed; Vital signs and O2 SAT reviewed;  Constitutional: Well developed, Well nourished, Well hydrated, In no acute distress; Head:  Normocephalic, atraumatic; Eyes: EOMI, PERRL, No scleral icterus; ENMT: Mouth and pharynx normal, Mucous membranes moist; Neck: Supple, Full range of motion, No lymphadenopathy; Cardiovascular: Regular rate and rhythm, No gallop; Respiratory: Breath sounds clear & equal bilaterally, No wheezes.  Speaking full sentences with ease, Normal respiratory effort/excursion; Chest: Nontender, Movement normal; Abdomen: Soft, +mid-epigastric tenderness to palp. No rebound or guarding. Nondistended, Normal bowel sounds; Genitourinary: No CVA tenderness; Extremities: Peripheral pulses normal, No tenderness, No edema, No calf edema or asymmetry.; Neuro: AA&Ox3, Major CN grossly intact.  Speech clear. No gross focal motor or sensory deficits in extremities.; Skin: Color normal, Warm, Dry.   ED Treatments / Results  Labs (all labs ordered are listed, but only abnormal results are displayed)   EKG None  Radiology   Procedures Procedures (including critical care time)  Medications Ordered in ED Medications  morphine 4 MG/ML injection 4 mg (has no administration in time range)  ondansetron (ZOFRAN) injection 4 mg (has no administration in time range)  famotidine (PEPCID) IVPB 20 mg premix (has no administration in time range)     Initial Impression / Assessment and Plan / ED Course  I have reviewed the triage vital signs and the nursing notes.  Pertinent labs & imaging results that were available during my care of the patient were reviewed by me and considered in my medical decision making (see chart for details).  MDM Reviewed: previous chart, nursing note and vitals Reviewed previous: labs Interpretation: labs, x-ray and CT scan   Results for orders placed or performed during the hospital encounter of 05/03/18   Comprehensive metabolic panel  Result Value Ref Range   Sodium 135 135 - 145 mmol/L   Potassium 3.1 (L) 3.5 - 5.1 mmol/L   Chloride 97 (L) 101 - 111 mmol/L   CO2 24 22 - 32 mmol/L   Glucose, Bld 157 (H) 65 - 99 mg/dL   BUN 8 6 - 20 mg/dL   Creatinine, Ser 4.09 0.61 - 1.24 mg/dL   Calcium 9.3 8.9 - 81.1 mg/dL   Total Protein 8.1 6.5 - 8.1 g/dL   Albumin 4.1 3.5 - 5.0 g/dL   AST 24 15 - 41 U/L   ALT 19 17 - 63 U/L   Alkaline Phosphatase 52 38 - 126 U/L   Total Bilirubin 0.9 0.3 - 1.2 mg/dL   GFR calc non Af Amer >60 >60 mL/min   GFR calc Af Amer >60 >60 mL/min   Anion gap 14 5 - 15  Ethanol  Result Value Ref Range   Alcohol, Ethyl (B) <10 <10 mg/dL  Lipase, blood  Result Value Ref Range   Lipase 103 (H) 11 - 51 U/L  CBC with Differential  Result Value Ref Range   WBC 9.0 4.0 - 10.5 K/uL  RBC 4.54 4.22 - 5.81 MIL/uL   Hemoglobin 14.5 13.0 - 17.0 g/dL   HCT 69.6 29.5 - 28.4 %   MCV 91.2 78.0 - 100.0 fL   MCH 31.9 26.0 - 34.0 pg   MCHC 35.0 30.0 - 36.0 g/dL   RDW 13.2 44.0 - 10.2 %   Platelets 206 150 - 400 K/uL   Neutrophils Relative % 82 %   Neutro Abs 7.4 1.7 - 7.7 K/uL   Lymphocytes Relative 12 %   Lymphs Abs 1.0 0.7 - 4.0 K/uL   Monocytes Relative 3 %   Monocytes Absolute 0.3 0.1 - 1.0 K/uL   Eosinophils Relative 3 %   Eosinophils Absolute 0.2 0.0 - 0.7 K/uL   Basophils Relative 0 %   Basophils Absolute 0.0 0.0 - 0.1 K/uL  Urinalysis, Routine w reflex microscopic  Result Value Ref Range   Color, Urine YELLOW YELLOW   APPearance HAZY (A) CLEAR   Specific Gravity, Urine 1.018 1.005 - 1.030   pH 5.0 5.0 - 8.0   Glucose, UA NEGATIVE NEGATIVE mg/dL   Hgb urine dipstick NEGATIVE NEGATIVE   Bilirubin Urine NEGATIVE NEGATIVE   Ketones, ur NEGATIVE NEGATIVE mg/dL   Protein, ur 30 (A) NEGATIVE mg/dL   Nitrite NEGATIVE NEGATIVE   Leukocytes, UA NEGATIVE NEGATIVE   RBC / HPF 0-5 0 - 5 RBC/hpf   WBC, UA 0-5 0 - 5 WBC/hpf   Bacteria, UA NONE SEEN NONE SEEN   Mucus  PRESENT    Hyaline Casts, UA PRESENT    Dg Chest 2 View Result Date: 05/03/2018 CLINICAL DATA:  Abdominal pain, pancreatitis, no improvement in symptoms EXAM: CHEST - 2 VIEW COMPARISON:  None FINDINGS: Normal heart size, mediastinal contours, and pulmonary vascularity. Lungs clear. No pleural effusion or pneumothorax. Bones unremarkable. IMPRESSION: Normal exam. Electronically Signed   By: Ulyses Southward M.D.   On: 05/03/2018 13:17   Ct Abdomen Pelvis W Contrast Result Date: 05/03/2018 CLINICAL DATA:  Epigastric pain.  Assess for pancreatitis. EXAM: CT ABDOMEN AND PELVIS WITH CONTRAST TECHNIQUE: Multidetector CT imaging of the abdomen and pelvis was performed using the standard protocol following bolus administration of intravenous contrast. CONTRAST:  ISOVUE-300 IOPAMIDOL (ISOVUE-300) INJECTION 61% COMPARISON:  December 26, 2017 FINDINGS: Lower chest: Minimal atelectasis of posterior lung bases are noted. The heart size is normal. Hepatobiliary: There is diffuse low density of the liver.No focal liver lesion is noted. The gallbladder is normal. The biliary tree is normal. Pancreas: Peripancreatic inflammation and stranding is noted. There is no pancreatic necrosis or pancreatic pseudocyst. Spleen: Normal in size without focal abnormality. Adrenals/Urinary Tract: Adrenal glands are unremarkable. Kidneys are normal, without renal calculi, focal lesion, or hydronephrosis. Bladder is unremarkable. Stomach/Bowel: Stomach is within normal limits. Appendix appears normal. No evidence of bowel wall thickening, distention, or inflammatory changes. Vascular/Lymphatic: No significant vascular findings are present. No enlarged abdominal or pelvic lymph nodes. Reproductive: Prostate is unremarkable. Other: None Musculoskeletal: Degenerative joint changes of the spine are noted. IMPRESSION: Findings consistent with acute pancreatitis. There is no evidence of pancreatic necrosis or pancreatic pseudocyst. Fatty  infiltration of liver. Electronically Signed   By: Sherian Rein M.D.   On: 05/03/2018 13:44    1350:   Given pt's continued c/o pain; CT was performed. CT revealed pancreatitis without complications. Offered admission, pt refuses. Pt states he wants to go home with rx pain meds. Will trial PO.  1530:   Potassium repleted PO. Pt has tol PO well while in the  ED without N/V.  No stooling while in the ED.  Feels better and continues to want to go home now (vs admit).  Requesting work note. Strict return precautions given. Dx and testing d/w pt.  Questions answered.  Verb understanding, agreeable to d/c home with outpt f/u.    Final Clinical Impressions(s) / ED Diagnoses   Final diagnoses:  None    ED Discharge Orders    None       Samuel Jester, DO 05/08/18 1610

## 2018-05-03 NOTE — ED Notes (Signed)
Patient given gingerale for fluid challenge, tolerating well.

## 2018-05-03 NOTE — Discharge Instructions (Addendum)
Eat a bland diet, avoiding greasy, fatty, fried foods, as well as spicy and acidic foods or beverages.  Avoid drinking alcohol. Take the prescriptions as directed.  Call your regular medical doctor tomorrow to schedule a follow up appointment in the next 3 days. Call the GI doctor tomorrow to schedule a follow up appointment within the next week.  Return to the Emergency Department immediately if worsening.

## 2018-05-03 NOTE — ED Triage Notes (Signed)
Epigastric abd pain, was seen here Friday for the same.  Told he has pancreatitis.  Pt states his nausea is better but he is still in pain.

## 2018-05-30 IMAGING — CT CT ABD-PELV W/ CM
2 of 5 series · 17 of 46 positions shown, 19 images · IV contrast (Isovue)
Comparison: December 26, 2017

CLINICAL DATA: Epigastric pain.  Assess for pancreatitis.

EXAM:
CT ABDOMEN AND PELVIS WITH CONTRAST
TECHNIQUE: Multidetector CT imaging of the abdomen and pelvis was performed
using the standard protocol following bolus administration of
intravenous contrast.
CONTRAST:  100mL [id number] IOPAMIDOL ([id number]) INJECTION 61%

[Series 2: axial st · axial · 0.72mm/px · z∈[-149,+261]mm · 14 of 94 slices shown, 16 images]
[im 6/94  soft-tissue]
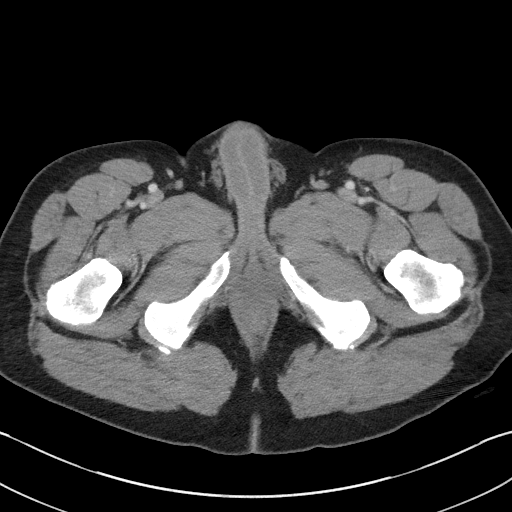
[im 6/94  bone]
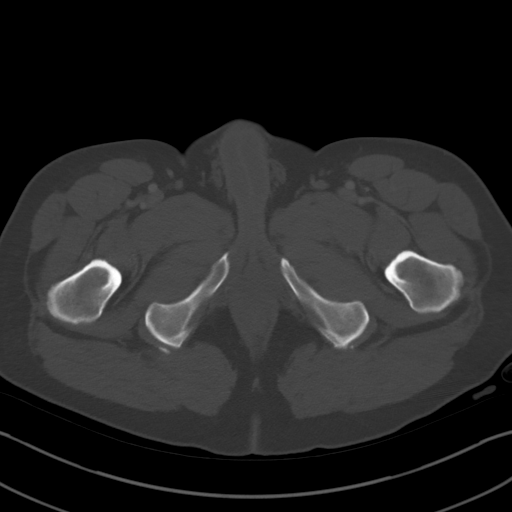
[im 11/94  soft-tissue]
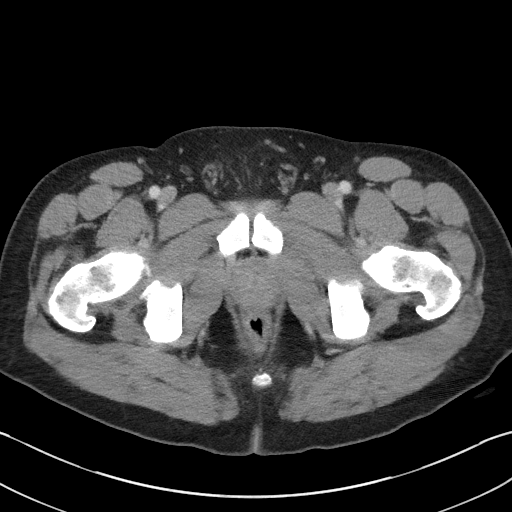
[im 17/94  soft-tissue]
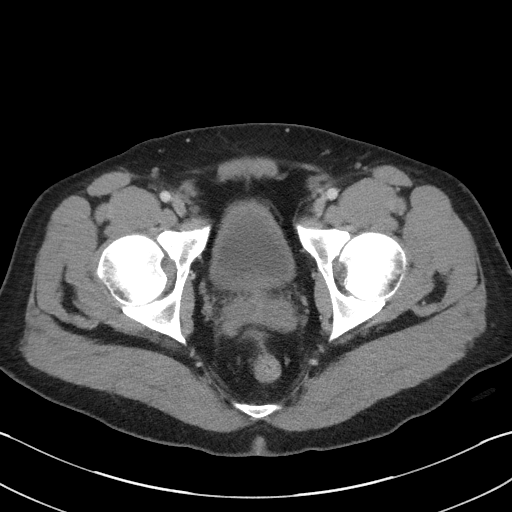
[im 28/94  soft-tissue]
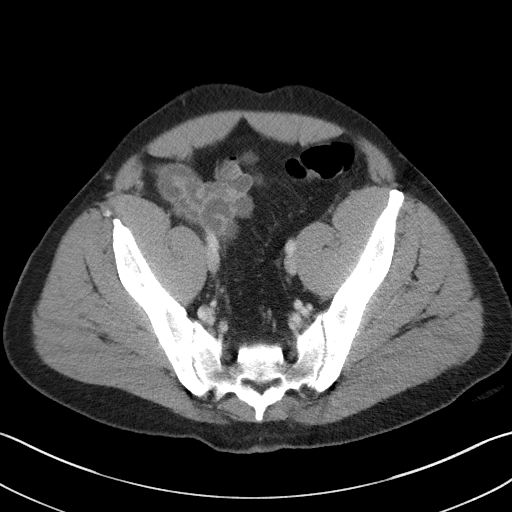
[im 33/94  soft-tissue]
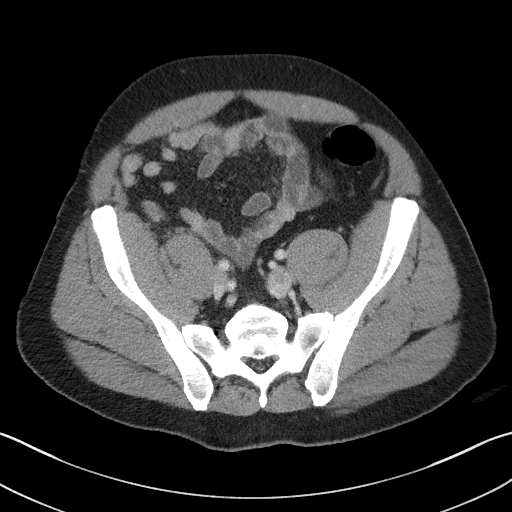
[im 39/94  soft-tissue]
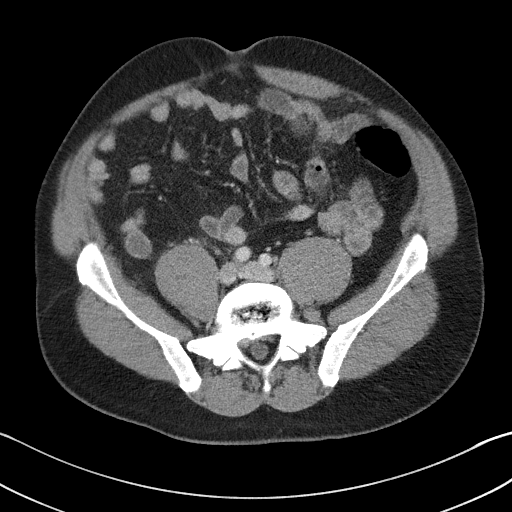
[im 44/94  soft-tissue]
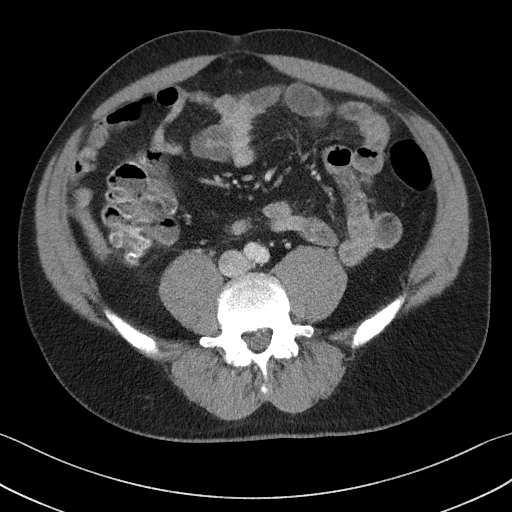
[im 50/94  soft-tissue]
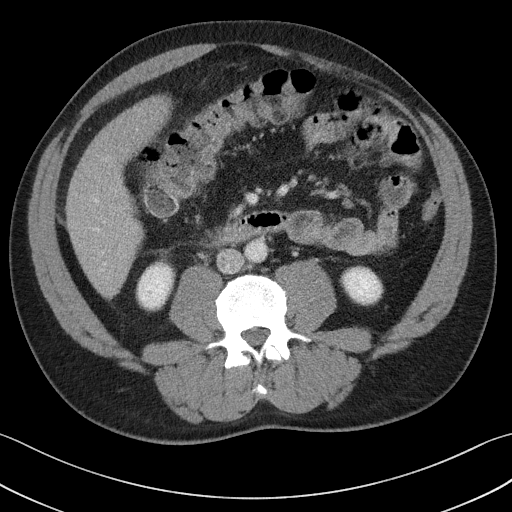
[im 55/94  soft-tissue]
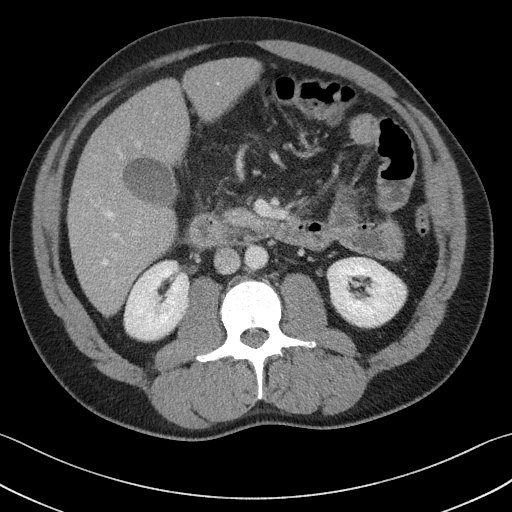
[im 55/94  bone]
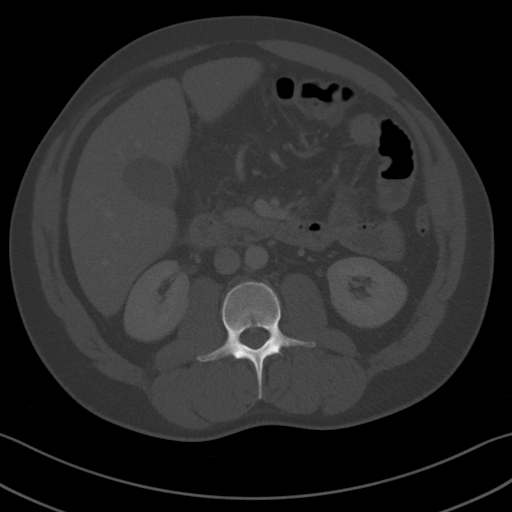
[im 61/94  soft-tissue]
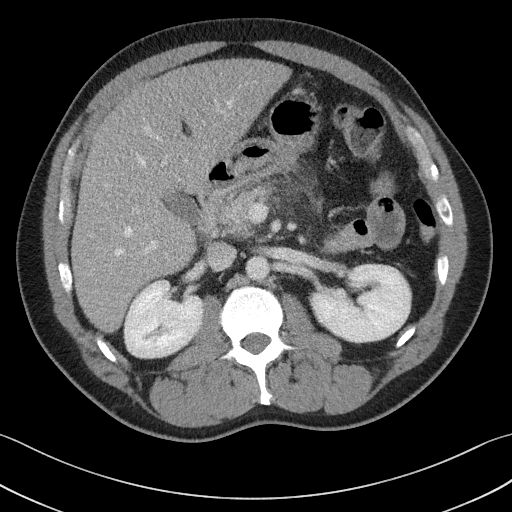
[im 72/94  soft-tissue]
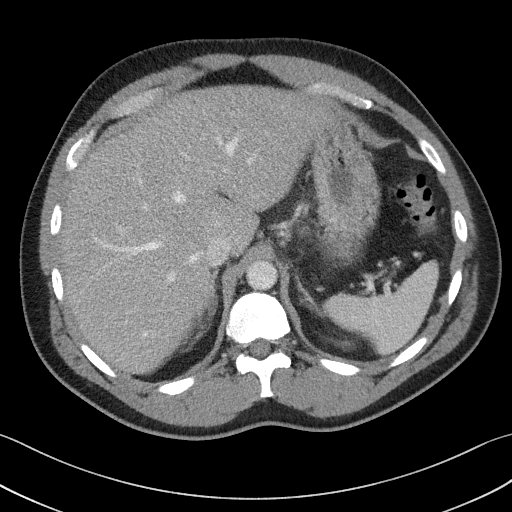
[im 77/94  soft-tissue]
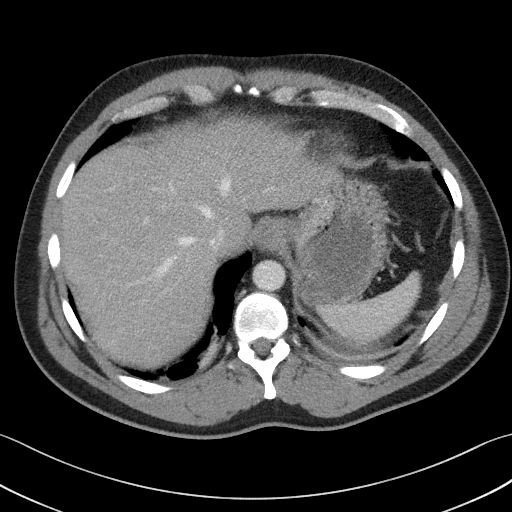
[im 83/94  soft-tissue]
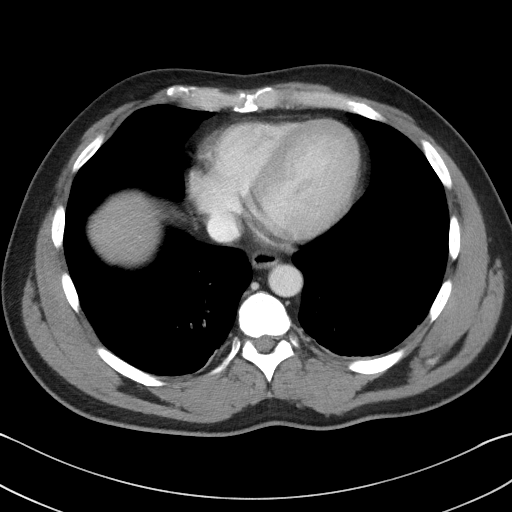
[im 88/94  soft-tissue]
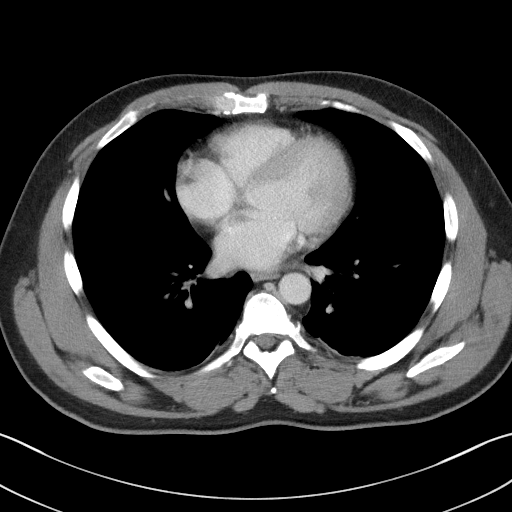

[Series 6: coronal st · coronal · 0.81mm/px · 3 of 117 slices shown]
[im 39/117  soft-tissue]
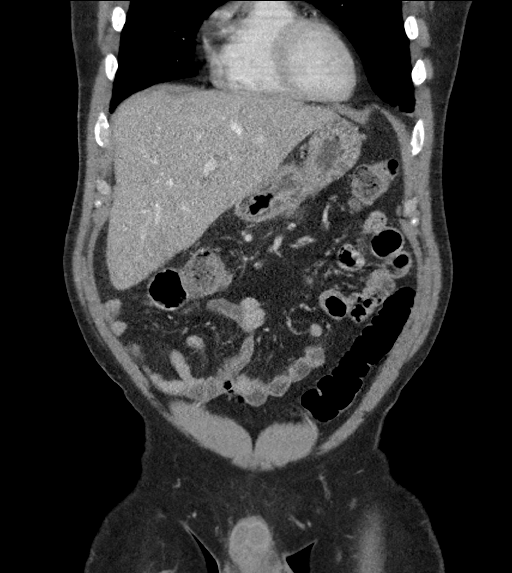
[im 52/117  soft-tissue]
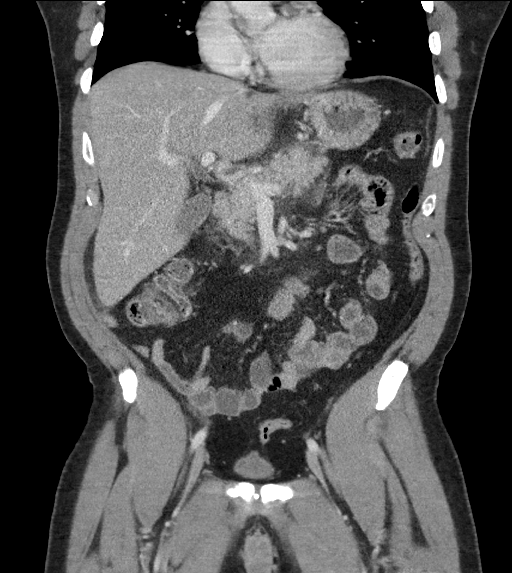
[im 65/117  soft-tissue]
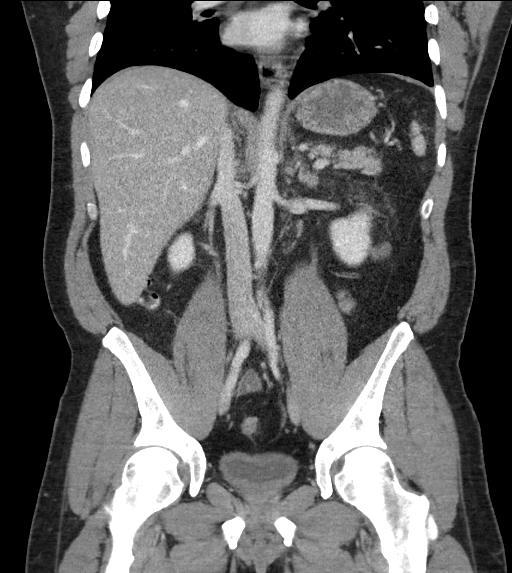

[17 of 46 positions shown; findings below may reference images not displayed]

FINDINGS: Lower chest: Minimal atelectasis of posterior lung bases are noted.
The heart size is normal.

Hepatobiliary: There is diffuse low density of the liver.No focal
liver lesion is noted. The gallbladder is normal. The biliary tree
is normal.

Pancreas: Peripancreatic inflammation and stranding is noted. There
is no pancreatic necrosis or pancreatic pseudocyst.

Spleen: Normal in size without focal abnormality.

Adrenals/Urinary Tract: Adrenal glands are unremarkable. Kidneys are
normal, without renal calculi, focal lesion, or hydronephrosis.
Bladder is unremarkable.

Stomach/Bowel: Stomach is within normal limits. Appendix appears
normal. No evidence of bowel wall thickening, distention, or
inflammatory changes.

Vascular/Lymphatic: No significant vascular findings are present. No
enlarged abdominal or pelvic lymph nodes.

Reproductive: Prostate is unremarkable.

Other: None

Musculoskeletal: Degenerative joint changes of the spine are noted.
IMPRESSION: Findings consistent with acute pancreatitis. There is no evidence of
pancreatic necrosis or pancreatic pseudocyst.

Fatty infiltration of liver.

## 2018-11-24 ENCOUNTER — Emergency Department (HOSPITAL_COMMUNITY): Payer: Self-pay

## 2018-11-24 ENCOUNTER — Other Ambulatory Visit: Payer: Self-pay

## 2018-11-24 ENCOUNTER — Encounter (HOSPITAL_COMMUNITY): Payer: Self-pay

## 2018-11-24 ENCOUNTER — Inpatient Hospital Stay (HOSPITAL_COMMUNITY)
Admission: EM | Admit: 2018-11-24 | Discharge: 2018-11-29 | DRG: 439 | Disposition: A | Discharge status: Home / Self Care | Payer: Self-pay | Attending: Internal Medicine | Admitting: Internal Medicine

## 2018-11-24 DIAGNOSIS — F10288 Alcohol dependence with other alcohol-induced disorder: Secondary | ICD-10-CM | POA: Diagnosis present

## 2018-11-24 DIAGNOSIS — E872 Acidosis: Secondary | ICD-10-CM | POA: Diagnosis not present

## 2018-11-24 DIAGNOSIS — E6609 Other obesity due to excess calories: Secondary | ICD-10-CM

## 2018-11-24 DIAGNOSIS — Z6831 Body mass index (BMI) 31.0-31.9, adult: Secondary | ICD-10-CM

## 2018-11-24 DIAGNOSIS — Z8249 Family history of ischemic heart disease and other diseases of the circulatory system: Secondary | ICD-10-CM

## 2018-11-24 DIAGNOSIS — K861 Other chronic pancreatitis: Secondary | ICD-10-CM | POA: Diagnosis present

## 2018-11-24 DIAGNOSIS — K852 Alcohol induced acute pancreatitis without necrosis or infection: Principal | ICD-10-CM | POA: Diagnosis present

## 2018-11-24 DIAGNOSIS — K859 Acute pancreatitis without necrosis or infection, unspecified: Secondary | ICD-10-CM | POA: Diagnosis present

## 2018-11-24 DIAGNOSIS — D696 Thrombocytopenia, unspecified: Secondary | ICD-10-CM | POA: Diagnosis not present

## 2018-11-24 DIAGNOSIS — R7302 Impaired glucose tolerance (oral): Secondary | ICD-10-CM | POA: Diagnosis present

## 2018-11-24 DIAGNOSIS — K8681 Exocrine pancreatic insufficiency: Secondary | ICD-10-CM | POA: Diagnosis present

## 2018-11-24 DIAGNOSIS — Z9119 Patient's noncompliance with other medical treatment and regimen: Secondary | ICD-10-CM

## 2018-11-24 DIAGNOSIS — Z8719 Personal history of other diseases of the digestive system: Secondary | ICD-10-CM | POA: Diagnosis present

## 2018-11-24 DIAGNOSIS — F10231 Alcohol dependence with withdrawal delirium: Secondary | ICD-10-CM | POA: Diagnosis present

## 2018-11-24 DIAGNOSIS — F1020 Alcohol dependence, uncomplicated: Secondary | ICD-10-CM | POA: Diagnosis present

## 2018-11-24 DIAGNOSIS — Z79899 Other long term (current) drug therapy: Secondary | ICD-10-CM

## 2018-11-24 DIAGNOSIS — I10 Essential (primary) hypertension: Secondary | ICD-10-CM | POA: Diagnosis present

## 2018-11-24 HISTORY — DX: Alcohol abuse, uncomplicated: F10.10

## 2018-11-24 HISTORY — DX: Essential (primary) hypertension: I10

## 2018-11-24 HISTORY — DX: Patient's noncompliance with other medical treatment and regimen due to unspecified reason: Z91.199

## 2018-11-24 HISTORY — DX: Patient's noncompliance with other medical treatment and regimen: Z91.19

## 2018-11-24 LAB — COMPREHENSIVE METABOLIC PANEL
ALBUMIN: 4.6 g/dL (ref 3.5–5.0)
ALK PHOS: 57 U/L (ref 38–126)
ALT: 51 U/L — ABNORMAL HIGH (ref 0–44)
AST: 53 U/L — ABNORMAL HIGH (ref 15–41)
Anion gap: 16 — ABNORMAL HIGH (ref 5–15)
BILIRUBIN TOTAL: 1.6 mg/dL — AB (ref 0.3–1.2)
BUN: 10 mg/dL (ref 6–20)
CALCIUM: 9.2 mg/dL (ref 8.9–10.3)
CO2: 24 mmol/L (ref 22–32)
Chloride: 98 mmol/L (ref 98–111)
Creatinine, Ser: 1.14 mg/dL (ref 0.61–1.24)
GFR calc Af Amer: >60 mL/min (ref >60)
GFR calc non Af Amer: >60 mL/min (ref >60)
GLUCOSE: 195 mg/dL — AB (ref 70–99)
Potassium: 3.9 mmol/L (ref 3.5–5.1)
Sodium: 138 mmol/L (ref 135–145)
TOTAL PROTEIN: 8.2 g/dL — AB (ref 6.5–8.1)

## 2018-11-24 LAB — LIPASE, BLOOD: Lipase: 1086 U/L — ABNORMAL HIGH (ref 11–51)

## 2018-11-24 LAB — URINALYSIS, ROUTINE W REFLEX MICROSCOPIC
Bacteria, UA: NONE SEEN
Bilirubin Urine: NEGATIVE
Glucose, UA: 50 mg/dL — AB
Ketones, ur: 5 mg/dL — AB
Leukocytes, UA: NEGATIVE
Nitrite: NEGATIVE
PROTEIN: 30 mg/dL — AB
Specific Gravity, Urine: 1.025 (ref 1.005–1.030)
pH: 5 (ref 5.0–8.0)

## 2018-11-24 LAB — RAPID URINE DRUG SCREEN, HOSP PERFORMED
Amphetamines: NOT DETECTED
BENZODIAZEPINES: NOT DETECTED
Barbiturates: NOT DETECTED
Cocaine: NOT DETECTED
Opiates: NOT DETECTED
Tetrahydrocannabinol: NOT DETECTED

## 2018-11-24 LAB — CBC WITH DIFFERENTIAL/PLATELET
ABS IMMATURE GRANULOCYTES: 0.03 10*3/uL (ref 0.00–0.07)
BASOS PCT: 0 %
Basophils Absolute: 0 10*3/uL (ref 0.0–0.1)
Eosinophils Absolute: 0.1 10*3/uL (ref 0.0–0.5)
Eosinophils Relative: 1 %
HCT: 45.7 % (ref 39.0–52.0)
Hemoglobin: 15.6 g/dL (ref 13.0–17.0)
IMMATURE GRANULOCYTES: 0 %
Lymphocytes Relative: 3 %
Lymphs Abs: 0.4 10*3/uL — ABNORMAL LOW (ref 0.7–4.0)
MCH: 32.2 pg (ref 26.0–34.0)
MCHC: 34.1 g/dL (ref 30.0–36.0)
MCV: 94.2 fL (ref 80.0–100.0)
MONO ABS: 0.5 10*3/uL (ref 0.1–1.0)
Monocytes Relative: 4 %
NEUTROS ABS: 11.7 10*3/uL — AB (ref 1.7–7.7)
NEUTROS PCT: 92 %
PLATELETS: 235 10*3/uL (ref 150–400)
RBC: 4.85 MIL/uL (ref 4.22–5.81)
RDW: 14.4 % (ref 11.5–15.5)
WBC: 12.8 10*3/uL — AB (ref 4.0–10.5)
nRBC: 0.2 % (ref 0.0–0.2)

## 2018-11-24 LAB — HEMOGLOBIN A1C
Hgb A1c MFr Bld: 5.8 % — ABNORMAL HIGH (ref 4.8–5.6)
Mean Plasma Glucose: 119.76 mg/dL

## 2018-11-24 LAB — ETHANOL

## 2018-11-24 MED ORDER — FOLIC ACID 1 MG PO TABS
1.0000 mg | ORAL_TABLET | Freq: Every day | ORAL | Status: DC
  Administered 2018-11-24 – 2018-11-29 (×6): 1 mg via ORAL
  Filled 2018-11-24 (×6): qty 1

## 2018-11-24 MED ORDER — ONDANSETRON HCL 4 MG PO TABS: 4.0000 mg | ORAL_TABLET | Freq: Four times a day (QID) | ORAL | Status: DC | PRN

## 2018-11-24 MED ORDER — MORPHINE SULFATE (PF) 4 MG/ML IV SOLN
4.0000 mg | INTRAVENOUS | Status: AC | PRN
  Administered 2018-11-24 (×2): 4 mg via INTRAVENOUS
  Filled 2018-11-24 (×2): qty 1

## 2018-11-24 MED ORDER — ACETAMINOPHEN 325 MG PO TABS
650.0000 mg | ORAL_TABLET | Freq: Four times a day (QID) | ORAL | Status: DC | PRN
  Administered 2018-11-24: 650 mg via ORAL
  Filled 2018-11-24: qty 2

## 2018-11-24 MED ORDER — HYDRALAZINE HCL 20 MG/ML IJ SOLN
10.0000 mg | Freq: Four times a day (QID) | INTRAMUSCULAR | Status: DC | PRN
  Administered 2018-11-24 – 2018-11-25 (×2): 10 mg via INTRAVENOUS
  Filled 2018-11-24 (×2): qty 1

## 2018-11-24 MED ORDER — ACETAMINOPHEN 650 MG RE SUPP: 650.0000 mg | Freq: Four times a day (QID) | RECTAL | Status: DC | PRN

## 2018-11-24 MED ORDER — THIAMINE HCL 100 MG/ML IJ SOLN: 100.0000 mg | Freq: Every day | INTRAMUSCULAR | Status: DC

## 2018-11-24 MED ORDER — POTASSIUM CHLORIDE IN NACL 20-0.9 MEQ/L-% IV SOLN
INTRAVENOUS | Status: DC
  Administered 2018-11-24 – 2018-11-28 (×7): via INTRAVENOUS

## 2018-11-24 MED ORDER — INFLUENZA VAC SPLIT QUAD 0.5 ML IM SUSY: 0.5000 mL | PREFILLED_SYRINGE | INTRAMUSCULAR | Status: DC

## 2018-11-24 MED ORDER — MORPHINE SULFATE (PF) 2 MG/ML IV SOLN
2.0000 mg | INTRAVENOUS | Status: DC | PRN
  Administered 2018-11-24 – 2018-11-26 (×10): 2 mg via INTRAVENOUS
  Filled 2018-11-24 (×10): qty 1

## 2018-11-24 MED ORDER — ADULT MULTIVITAMIN W/MINERALS CH
1.0000 | ORAL_TABLET | Freq: Every day | ORAL | Status: DC
  Administered 2018-11-24 – 2018-11-29 (×6): 1 via ORAL
  Filled 2018-11-24 (×6): qty 1

## 2018-11-24 MED ORDER — LORAZEPAM 2 MG/ML IJ SOLN
1.0000 mg | Freq: Four times a day (QID) | INTRAMUSCULAR | Status: DC | PRN
  Administered 2018-11-25 – 2018-11-26 (×4): 1 mg via INTRAVENOUS
  Filled 2018-11-24 (×4): qty 1

## 2018-11-24 MED ORDER — FAMOTIDINE IN NACL 20-0.9 MG/50ML-% IV SOLN
20.0000 mg | Freq: Once | INTRAVENOUS | Status: AC
  Administered 2018-11-24: 20 mg via INTRAVENOUS
  Filled 2018-11-24: qty 50

## 2018-11-24 MED ORDER — VITAMIN B-1 100 MG PO TABS
100.0000 mg | ORAL_TABLET | Freq: Every day | ORAL | Status: DC
  Administered 2018-11-24 – 2018-11-29 (×6): 100 mg via ORAL
  Filled 2018-11-24 (×6): qty 1

## 2018-11-24 MED ORDER — LORAZEPAM 1 MG PO TABS
1.0000 mg | ORAL_TABLET | Freq: Four times a day (QID) | ORAL | Status: DC | PRN
  Administered 2018-11-25: 1 mg via ORAL
  Filled 2018-11-24: qty 1

## 2018-11-24 MED ORDER — PROMETHAZINE HCL 25 MG/ML IJ SOLN
12.5000 mg | Freq: Once | INTRAMUSCULAR | Status: AC
  Administered 2018-11-24: 12.5 mg via INTRAVENOUS
  Filled 2018-11-24: qty 1

## 2018-11-24 MED ORDER — ENOXAPARIN SODIUM 40 MG/0.4ML ~~LOC~~ SOLN
40.0000 mg | SUBCUTANEOUS | Status: DC
  Administered 2018-11-24 – 2018-11-25 (×2): 40 mg via SUBCUTANEOUS
  Filled 2018-11-24 (×2): qty 0.4

## 2018-11-24 MED ORDER — ONDANSETRON HCL 4 MG/2ML IJ SOLN: 4.0000 mg | Freq: Four times a day (QID) | INTRAMUSCULAR | Status: DC | PRN

## 2018-11-24 MED ORDER — IOPAMIDOL (ISOVUE-300) INJECTION 61%
100.0000 mL | Freq: Once | INTRAVENOUS | Status: AC | PRN
  Administered 2018-11-24: 100 mL via INTRAVENOUS

## 2018-11-24 MED ORDER — SODIUM CHLORIDE 0.9 % IV BOLUS
1000.0000 mL | Freq: Once | INTRAVENOUS | Status: AC
  Administered 2018-11-24: 1000 mL via INTRAVENOUS

## 2018-11-24 MED ORDER — FAMOTIDINE IN NACL 20-0.9 MG/50ML-% IV SOLN
20.0000 mg | Freq: Two times a day (BID) | INTRAVENOUS | Status: DC
  Administered 2018-11-24 – 2018-11-27 (×6): 20 mg via INTRAVENOUS
  Filled 2018-11-24 (×6): qty 50

## 2018-11-24 NOTE — ED Provider Notes (Signed)
[facility] EMERGENCY DEPARTMENT Provider Note   CSN: [id number] Arrival date & time: 11/24/18  1035     History   Chief Complaint Chief Complaint  Patient presents with  . Emesis    HPI COPELAND NEISEN is a 47 y.o. male.  HPI Pt was seen at 1100.  Per pt, c/o gradual onset and persistence of constant generalized abd "pain" since yesterday.  Has been associated with multiple intermittent episodes of N/V/D.  Describes the abd pain as "cramping."  States LD etoh was several days ago. Pt also states he has not taken his BP meds in at least 1 month or more because he has "run out."  Denies fevers, no back pain, no rash, no CP/SOB, no black or blood in stools or emesis.       Past Medical History:  Diagnosis Date  . Alcohol abuse   . Hypertension   . Noncompliance   . Pancreatitis 2007    Patient Active Problem List   Diagnosis Date Noted  . Impaired glucose tolerance 12/28/2017  . Hypokalemia 12/28/2017  . Essential hypertension   . Pancreatitis, recurrent 12/27/2017  . Accelerated hypertension 12/27/2017  . Acute pancreatitis 12/27/2017  . Acute alcoholic pancreatitis 12/27/2017  . Alcohol dependence (HCC) 12/27/2017  . Unspecified essential hypertension 12/27/2017  . Hyperglycemia 12/27/2017    History reviewed. No pertinent surgical history.      Home Medications    Prior to Admission medications   Medication Sig Start Date End Date Taking? Authorizing Provider  Multiple Vitamin (MULTIVITAMIN WITH MINERALS) TABS tablet Take 1 tablet by mouth daily.   Yes [provider]  sodium-potassium bicarbonate (ALKA-SELTZER GOLD) TBEF dissolvable tablet Take 1 tablet by mouth daily as needed.   Yes [provider]    Family History Family History  Problem Relation Age of Onset  . Hypertension Mother     Social History Social History   Tobacco Use  . Smoking status: Never Smoker  . Smokeless tobacco: Never Used  Substance Use Topics  .  Alcohol use: Yes    Alcohol/week: 2.0 standard drinks    Types: 1 Cans of beer, 1 Shots of liquor per week    Comment: soc  . Drug use: No     Allergies   Patient has no known allergies.   Review of Systems Review of Systems ROS: Statement: All systems negative except as marked or noted in the HPI; Constitutional: Negative for fever and chills. ; ; Eyes: Negative for eye pain, redness and discharge. ; ; ENMT: Negative for ear pain, hoarseness, nasal congestion, sinus pressure and sore throat. ; ; Cardiovascular: Negative for chest pain, palpitations, diaphoresis, dyspnea and peripheral edema. ; ; Respiratory: Negative for cough, wheezing and stridor. ; ; Gastrointestinal: +N/V/D, abd pain. Negative for blood in stool, hematemesis, jaundice and rectal bleeding. . ; ; Genitourinary: Negative for dysuria, flank pain and hematuria. ; ; Musculoskeletal: Negative for back pain and neck pain. Negative for swelling and trauma.; ; Skin: Negative for pruritus, rash, abrasions, blisters, bruising and skin lesion.; ; Neuro: Negative for headache, lightheadedness and neck stiffness. Negative for weakness, altered level of consciousness, altered mental status, extremity weakness, paresthesias, involuntary movement, seizure and syncope.       Physical Exam Updated Vital Signs BP (!) 192/112 (BP Location: Left Arm)   Pulse 83   Temp 98.7 F (37.1 C) (Oral)   Resp 18   Ht 5\' 8"  (1.727 m)   Wt 93 kg  SpO2 100%   BMI 31.17 kg/m   Physical Exam 1105: Physical examination:  Nursing notes reviewed; Vital signs and O2 SAT reviewed;  Constitutional: Well developed, Well nourished, Uncomfortable appearing.; Head:  Normocephalic, atraumatic; Eyes: EOMI, PERRL, No scleral icterus; ENMT: Mouth and pharynx normal, Mucous membranes dry; Neck: Supple, Full range of motion, No lymphadenopathy; Cardiovascular: Regular rate and rhythm, No gallop; Respiratory: Breath sounds clear & equal bilaterally, No wheezes.   Speaking full sentences with ease, Normal respiratory effort/excursion; Chest: Nontender, Movement normal; Abdomen: Soft, +diffuse tenderness to palp. Nondistended, Normal bowel sounds; Genitourinary: No CVA tenderness; Extremities: Peripheral pulses normal, No tenderness, No edema, No calf edema or asymmetry.; Neuro: AA&Ox3, Major CN grossly intact.  Speech clear. No gross focal motor or sensory deficits in extremities.; Skin: Color normal, Warm, Dry.   ED Treatments / Results  Labs (all labs ordered are listed, but only abnormal results are displayed)   EKG None  Radiology   Procedures Procedures (including critical care time)  Medications Ordered in ED Medications  sodium chloride 0.9 % bolus 1,000 mL (1,000 mLs Intravenous New Bag/Given 11/24/18 1234)  morphine 4 MG/ML injection 4 mg (4 mg Intravenous Given 11/24/18 1205)  famotidine (PEPCID) IVPB 20 mg premix (0 mg Intravenous Stopped 11/24/18 1234)  promethazine (PHENERGAN) injection 12.5 mg (12.5 mg Intravenous Given 11/24/18 1154)  iopamidol (ISOVUE-300) 61 % injection 100 mL (100 mLs Intravenous Contrast Given 11/24/18 1216)     Initial Impression / Assessment and Plan / ED Course  I have reviewed the triage vital signs and the nursing notes.  Pertinent labs & imaging results that were available during my care of the patient were reviewed by me and considered in my medical decision making (see chart for details).  MDM Reviewed: previous chart, nursing note and vitals Reviewed previous: labs Interpretation: labs, x-ray and CT scan   Results for orders placed or performed during the hospital encounter of 11/24/18  Lipase, blood  Result Value Ref Range   Lipase 1,086 (H) 11 - 51 U/L  Comprehensive metabolic panel  Result Value Ref Range   Sodium 138 135 - 145 mmol/L   Potassium 3.9 3.5 - 5.1 mmol/L   Chloride 98 98 - 111 mmol/L   CO2 24 22 - 32 mmol/L   Glucose, Bld 195 (H) 70 - 99 mg/dL   BUN 10 6 - 20 mg/dL    Creatinine, Ser 1.611.14 0.61 - 1.24 mg/dL   Calcium 9.2 8.9 - 09.610.3 mg/dL   Total Protein 8.2 (H) 6.5 - 8.1 g/dL   Albumin 4.6 3.5 - 5.0 g/dL   AST 53 (H) 15 - 41 U/L   ALT 51 (H) 0 - 44 U/L   Alkaline Phosphatase 57 38 - 126 U/L   Total Bilirubin 1.6 (H) 0.3 - 1.2 mg/dL   GFR calc non Af Amer >60 >60 mL/min   GFR calc Af Amer >60 >60 mL/min   Anion gap 16 (H) 5 - 15  Urinalysis, Routine w reflex microscopic  Result Value Ref Range   Color, Urine AMBER (A) YELLOW   APPearance HAZY (A) CLEAR   Specific Gravity, Urine 1.025 1.005 - 1.030   pH 5.0 5.0 - 8.0   Glucose, UA 50 (A) NEGATIVE mg/dL   Hgb urine dipstick SMALL (A) NEGATIVE   Bilirubin Urine NEGATIVE NEGATIVE   Ketones, ur 5 (A) NEGATIVE mg/dL   Protein, ur 30 (A) NEGATIVE mg/dL   Nitrite NEGATIVE NEGATIVE   Leukocytes, UA NEGATIVE NEGATIVE  RBC / HPF 6-10 0 - 5 RBC/hpf   WBC, UA 0-5 0 - 5 WBC/hpf   Bacteria, UA NONE SEEN NONE SEEN   Squamous Epithelial / LPF 0-5 0 - 5   Mucus PRESENT    Hyaline Casts, UA PRESENT    Uric Acid Crys, UA PRESENT   CBC with Differential  Result Value Ref Range   WBC 12.8 (H) 4.0 - 10.5 K/uL   RBC 4.85 4.22 - 5.81 MIL/uL   Hemoglobin 15.6 13.0 - 17.0 g/dL   HCT 16.1 09.6 - 04.5 %   MCV 94.2 80.0 - 100.0 fL   MCH 32.2 26.0 - 34.0 pg   MCHC 34.1 30.0 - 36.0 g/dL   RDW 40.9 81.1 - 91.4 %   Platelets 235 150 - 400 K/uL   nRBC 0.2 0.0 - 0.2 %   Neutrophils Relative % 92 %   Neutro Abs 11.7 (H) 1.7 - 7.7 K/uL   Lymphocytes Relative 3 %   Lymphs Abs 0.4 (L) 0.7 - 4.0 K/uL   Monocytes Relative 4 %   Monocytes Absolute 0.5 0.1 - 1.0 K/uL   Eosinophils Relative 1 %   Eosinophils Absolute 0.1 0.0 - 0.5 K/uL   Basophils Relative 0 %   Basophils Absolute 0.0 0.0 - 0.1 K/uL   Immature Granulocytes 0 %   Abs Immature Granulocytes 0.03 0.00 - 0.07 K/uL  Ethanol  Result Value Ref Range   Alcohol, Ethyl (B) <10 <10 mg/dL  Urine rapid drug screen (hosp performed)  Result Value Ref Range    Opiates NONE DETECTED NONE DETECTED   Cocaine NONE DETECTED NONE DETECTED   Benzodiazepines NONE DETECTED NONE DETECTED   Amphetamines NONE DETECTED NONE DETECTED   Tetrahydrocannabinol NONE DETECTED NONE DETECTED   Barbiturates NONE DETECTED NONE DETECTED   Dg Chest 2 View Result Date: 11/24/2018 CLINICAL DATA:  Abdominal pain.  Nausea, vomiting, diarrhea. EXAM: CHEST - 2 VIEW COMPARISON:  Chest x-ray 05/03/2018. FINDINGS: Mediastinum and hilar structures normal. Low lung volumes. No focal alveolar infiltrate. No pleural effusion or pneumothorax. No acute bony abnormality. IMPRESSION: No acute cardiopulmonary disease. Electronically Signed   By: Maisie Fus  Register   On: 11/24/2018 11:48   Ct Abdomen Pelvis W Contrast Result Date: 11/24/2018 CLINICAL DATA:  Abdominal pain for 2 days EXAM: CT ABDOMEN AND PELVIS WITH CONTRAST TECHNIQUE: Multidetector CT imaging of the abdomen and pelvis was performed using the standard protocol following bolus administration of intravenous contrast. CONTRAST:  ISOVUE-300 IOPAMIDOL (ISOVUE-300) INJECTION 61% COMPARISON:  05/03/2018 FINDINGS: Lower chest: No acute abnormality. Hepatobiliary: Liver is diffusely fatty infiltrated. Minimal perihepatic fluid is noted. The gallbladder is unremarkable. Pancreas: The pancreas is well visualized and demonstrates a normal enhancement pattern. Considerable peripancreatic inflammatory change is noted consistent with acute pancreatitis. The overall appearance is increased when compared with the prior exam with more fluid along the anterior aspect of Gerota's fascia bilaterally and free fluid within the pelvis. Spleen: Normal in size without focal abnormality. Adrenals/Urinary Tract: Adrenal glands are within normal limits. Kidneys are well visualize without evidence of renal calculi or obstructive changes. The bladder is decompressed. Stomach/Bowel: The appendix is within normal limits. No obstructive or inflammatory changes in the  bowel are seen. Vascular/Lymphatic: No significant vascular findings are present. No enlarged abdominal or pelvic lymph nodes. Reproductive: Prostate is unremarkable. Other: Free fluid is noted within the abdomen and pelvis related to the pancreatitis. No hernia is identified. Musculoskeletal: Mild degenerative changes of lumbar spine are noted. IMPRESSION:  Changes consistent with acute pancreatitis with significant peripancreatic fluid and free fluid within the abdomen and pelvis. Fatty infiltration of the liver. No other focal abnormality is noted. Electronically Signed   By: Alcide Clever M.D.   On: 11/24/2018 12:43    1320:  Lipase elevated. CT with pancreatitis and free fluid. Pt continues uncomfortable and does not think he can go home. Dx and testing d/w pt.  Questions answered.  Verb understanding, agreeable to admit.  T/C returned from [facility] Dr. Gonzella Lex, case discussed, including:  HPI, pertinent PM/SHx, VS/PE, dx testing, ED course and treatment:  Agreeable to admit.    Final Clinical Impressions(s) / ED Diagnoses   Final diagnoses:  None    ED Discharge Orders    None       Samuel Jester, DO 11/28/18 1610

## 2018-11-24 NOTE — ED Triage Notes (Signed)
Pt c/o abd pain n/v/d since last night.

## 2018-11-24 NOTE — ED Notes (Signed)
Patient transported to X-ray 

## 2018-11-24 NOTE — ED Notes (Signed)
Patient transported to CT 

## 2018-11-24 NOTE — H&P (Signed)
[facility] H&P   Patient Demographics:    Matthew Moody, is a 47 y.o. male  MRN: [id number]   DOB - [birth year]  Admit Date - 11/24/2018  Outpatient Primary MD for the patient is Patient, No Pcp Per  Referring MD: Dr. Clarene Duke  Outpatient Specialists: None  Patient coming from: Home  Chief Complaint  Patient presents with  . Emesis      HPI:    Matthew Moody  is a 47 y.o. male, with history of ongoing alcohol abuse, alcoholic pancreatitis, hospitalized in January 2019 presented to the ED with 2 days history of progressive epigastric pain radiating to the sides, nausea with nonbilious, nonbloody vomiting and poor p.o. intake.  Reports his last drink to be on the day of Thanksgiving (5 days ago).  He normally drinks about one half to full pint of vodka daily.  He reports having several episodes of vomiting and since yesterday also having some loose bowel movements. He denies any hematemesis, melena, bright red blood per rectum.  Reports some headache but denies any blurred vision, confusion, hallucinations or tremors.  Denies any other medications besides multivitamin.  Denies chest pain, shortness of breath, palpitation, fevers or chills.  No recent travel or sick contact.  Course in the ED Patient was hypertensive with blood pressure 192/112 mmHg, had low-grade fever of 99.5 F and remaining vitals were stable.  Blood work showed WC of 12 point 8K, normal electrolytes.  Mild transaminitis (50s) with total bilirubin 1.6, anion gap of 16 and markedly elevated lipase of thousand 86.  Alcohol level was undetectable and urine drug screen was negative. Patient given 4 mg IV morphine, 1 L normal saline bolus. CT of the abdomen pelvis showing findings of acute pancreatitis with significant peripancreatic fluid and free fluid within the abdomen and pelvis.  Chest x-ray unremarkable. Hospitalist  consulted for admission to medical floor.    Review of systems:    In addition to the HPI above,  No Fever-chills, No Headache, No changes with Vision or hearing, No problems swallowing food or Liquids, No Chest pain, Cough or Shortness of Breath, Abdominal pain++++, nausea++, vomiting++, diarrhea+ No Blood in stool or Urine, No dysuria, No new skin rashes or bruises, No new joints pains-aches,  No new weakness, tingling, numbness in any extremity, No recent weight gain or loss, No polyuria, polydypsia or polyphagia, No significant Mental Stressors.    With Past History of the following :    Past Medical History:  Diagnosis Date  . Alcohol abuse   . Hypertension   . Noncompliance   . Pancreatitis 2007      No surgical history.   Social History:     Social History   Tobacco Use  . Smoking status: Never Smoker  . Smokeless tobacco: Never Used  Substance Use Topics  . Alcohol use: Yes    Alcohol/week: 2.0  standard drinks    Types: 1 Cans of beer, 1 Shots of liquor per week    Comment: soc     Lives -home with wife  Mobility -independent     Family History :     Family History  Problem Relation Age of Onset  . Hypertension Mother       Home Medications:   Prior to Admission medications   Medication Sig Start Date End Date Taking? Authorizing Provider  Multiple Vitamin (MULTIVITAMIN WITH MINERALS) TABS tablet Take 1 tablet by mouth daily.   Yes [provider]  sodium-potassium bicarbonate (ALKA-SELTZER GOLD) TBEF dissolvable tablet Take 1 tablet by mouth daily as needed.   Yes [provider]     Allergies:    No Known Allergies   Physical Exam:   Vitals  Blood pressure (!) 186/96, pulse 79, temperature 98.7 F (37.1 C), temperature source Oral, resp. rate 16, height 5\' 8"  (1.727 m), weight 93 kg, SpO2 100 %.   General: Middle-aged male lying in bed in some distress with pain HEENT: Pupils reactive bilaterally, EOMI,  no pallor, no icterus, dry mucosa, supple neck Chest: Clear to auscultation bilaterally, no added sound CVs: Normal S1-S2, no murmurs GI: Tense with distention, bowel sounds present, diffuse tenderness (more pronounced in the epigastric and periumbilical area Musculoskeletal: Warm, no edema CNS: Alert and oriented, no tremors   Data Review:    CBC Recent Labs  Lab 11/24/18 1112  WBC 12.8*  HGB 15.6  HCT 45.7  PLT 235  MCV 94.2  MCH 32.2  MCHC 34.1  RDW 14.4  LYMPHSABS 0.4*  MONOABS 0.5  EOSABS 0.1  BASOSABS 0.0   ------------------------------------------------------------------------------------------------------------------  Chemistries  Recent Labs  Lab 11/24/18 1106  NA 138  K 3.9  CL 98  CO2 24  GLUCOSE 195*  BUN 10  CREATININE 1.14  CALCIUM 9.2  AST 53*  ALT 51*  ALKPHOS 57  BILITOT 1.6*   ------------------------------------------------------------------------------------------------------------------ estimated creatinine clearance is 90.5 mL/min (by C-G formula based on SCr of 1.14 mg/dL). ------------------------------------------------------------------------------------------------------------------ No results for input(s): TSH, T4TOTAL, T3FREE, THYROIDAB in the last 72 hours.  Invalid input(s): FREET3  Coagulation profile No results for input(s): INR, PROTIME in the last 168 hours. ------------------------------------------------------------------------------------------------------------------- No results for input(s): DDIMER in the last 72 hours. -------------------------------------------------------------------------------------------------------------------  Cardiac Enzymes No results for input(s): CKMB, TROPONINI, MYOGLOBIN in the last 168 hours.  Invalid input(s): CK ------------------------------------------------------------------------------------------------------------------ No results found for:  BNP   ---------------------------------------------------------------------------------------------------------------  Urinalysis    Component Value Date/Time   COLORURINE AMBER (A) 11/24/2018 1058   APPEARANCEUR HAZY (A) 11/24/2018 1058   LABSPEC 1.025 11/24/2018 1058   PHURINE 5.0 11/24/2018 1058   GLUCOSEU 50 (A) 11/24/2018 1058   HGBUR SMALL (A) 11/24/2018 1058   BILIRUBINUR NEGATIVE 11/24/2018 1058   KETONESUR 5 (A) 11/24/2018 1058   PROTEINUR 30 (A) 11/24/2018 1058   UROBILINOGEN 0.2 06/26/2015 1551   NITRITE NEGATIVE 11/24/2018 1058   LEUKOCYTESUR NEGATIVE 11/24/2018 1058    ----------------------------------------------------------------------------------------------------------------   Imaging Results:    Dg Chest 2 View  Result Date: 11/24/2018 CLINICAL DATA:  Abdominal pain.  Nausea, vomiting, diarrhea. EXAM: CHEST - 2 VIEW COMPARISON:  Chest x-ray 05/03/2018. FINDINGS: Mediastinum and hilar structures normal. Low lung volumes. No focal alveolar infiltrate. No pleural effusion or pneumothorax. No acute bony abnormality. IMPRESSION: No acute cardiopulmonary disease. Electronically Signed   By: Maisie Fus  Register   On: 11/24/2018 11:48   Ct Abdomen Pelvis W Contrast  Result Date: 11/24/2018  CLINICAL DATA:  Abdominal pain for 2 days EXAM: CT ABDOMEN AND PELVIS WITH CONTRAST TECHNIQUE: Multidetector CT imaging of the abdomen and pelvis was performed using the standard protocol following bolus administration of intravenous contrast. CONTRAST:  100mL ISOVUE-300 IOPAMIDOL (ISOVUE-300) INJECTION 61% COMPARISON:  05/03/2018 FINDINGS: Lower chest: No acute abnormality. Hepatobiliary: Liver is diffusely fatty infiltrated. Minimal perihepatic fluid is noted. The gallbladder is unremarkable. Pancreas: The pancreas is well visualized and demonstrates a normal enhancement pattern. Considerable peripancreatic inflammatory change is noted consistent with acute pancreatitis. The overall  appearance is increased when compared with the prior exam with more fluid along the anterior aspect of Gerota's fascia bilaterally and free fluid within the pelvis. Spleen: Normal in size without focal abnormality. Adrenals/Urinary Tract: Adrenal glands are within normal limits. Kidneys are well visualize without evidence of renal calculi or obstructive changes. The bladder is decompressed. Stomach/Bowel: The appendix is within normal limits. No obstructive or inflammatory changes in the bowel are seen. Vascular/Lymphatic: No significant vascular findings are present. No enlarged abdominal or pelvic lymph nodes. Reproductive: Prostate is unremarkable. Other: Free fluid is noted within the abdomen and pelvis related to the pancreatitis. No hernia is identified. Musculoskeletal: Mild degenerative changes of lumbar spine are noted. IMPRESSION: Changes consistent with acute pancreatitis with significant peripancreatic fluid and free fluid within the abdomen and pelvis. Fatty infiltration of the liver. No other focal abnormality is noted. Electronically Signed   By: Alcide CleverMark  Lukens M.D.   On: 11/24/2018 12:43    My personal review of EKG: Pending   Assessment & Plan:    Principal Problem:   Acute recurrent alcoholic pancreatitis Ongoing heavy alcohol use, drinks half-1 pint every day.  Has abdominal distention and tenderness.  No signs of withdrawal. Admit to MedSurg.  Strict n.p.o.  Aggressive hydration with IV normal saline, keep potassium >4.  Pain control with PRN IV morphine 2 mg every 3 hours.  Supportive care with antiemetics and Pepcid twice daily. Serial abdominal exam and monitor closely for complications including pancreatic necrosis or abscess.  Monitor lipase and LFTs in a.m. GI consult if symptoms worsened and no significant improvement. Counseled strongly on alcohol cessation.  Will need pancreatic supplement upon discharge.  Active Problems:   Pancreatitis, recurrent   Accelerated  hypertension Not on any medications.  I think patient does have an underlying hypertension which is not treated.also worsened by acute symptoms.  Placed on empiric IV hydralazine.    Alcohol dependence (HCC) Last alcohol use was on 11/29.  Levels undetected.  No signs of active withdrawal.  Given heavy use will monitor on CIWA.  Added thiamine, folate and multivitamin.    Impaired glucose tolerance Check A1c.       DVT Prophylaxis: Subcu Lovenox  AM Labs Ordered, also please review Full Orders  Family Communication: Admission, patients condition and plan of care including tests being ordered have been discussed with the patient and his wife at bedside  Code Status full code  Likely DC to home  Condition GUARDED    Consults called: None  Admission status: Inpatient  Inpatient status Patient presenting with acute recurrent pancreatitis with abdominal tenderness and distention,Accelerated hypertension with ongoing nausea and vomiting along with dehydration. Patient needs aggressive IV hydration and close monitoring for at least 48 hours with high risk for complications including pancreatic necrosis, pancreatic pseudocyst and alcohol withdrawal symptoms.  Time spent in minutes : 50   Matthew Moody M.D on 11/24/2018 at 3:16 PM  Between 7am to 7pm -  Pager - [phone]. After 7pm go to [email or web address] - password [facility]  [facility] - Office  [phone]

## 2018-11-25 LAB — CBC
HCT: 42.8 % (ref 39.0–52.0)
Hemoglobin: 14.6 g/dL (ref 13.0–17.0)
MCH: 32.6 pg (ref 26.0–34.0)
MCHC: 34.1 g/dL (ref 30.0–36.0)
MCV: 95.5 fL (ref 80.0–100.0)
Platelets: 191 10*3/uL (ref 150–400)
RBC: 4.48 MIL/uL (ref 4.22–5.81)
RDW: 15.1 % (ref 11.5–15.5)
WBC: 13.1 10*3/uL — ABNORMAL HIGH (ref 4.0–10.5)
nRBC: 0.2 % (ref 0.0–0.2)

## 2018-11-25 LAB — COMPREHENSIVE METABOLIC PANEL
ALT: 35 U/L (ref 0–44)
AST: 49 U/L — ABNORMAL HIGH (ref 15–41)
Albumin: 3.7 g/dL (ref 3.5–5.0)
Alkaline Phosphatase: 44 U/L (ref 38–126)
Anion gap: 13 (ref 5–15)
BUN: 10 mg/dL (ref 6–20)
CO2: 19 mmol/L — ABNORMAL LOW (ref 22–32)
Calcium: 7.8 mg/dL — ABNORMAL LOW (ref 8.9–10.3)
Chloride: 104 mmol/L (ref 98–111)
Creatinine, Ser: 0.9 mg/dL (ref 0.61–1.24)
GFR calc Af Amer: >60 mL/min (ref >60)
GFR calc non Af Amer: >60 mL/min (ref >60)
GLUCOSE: 168 mg/dL — AB (ref 70–99)
Potassium: 3.7 mmol/L (ref 3.5–5.1)
Sodium: 136 mmol/L (ref 135–145)
Total Bilirubin: 1.1 mg/dL (ref 0.3–1.2)
Total Protein: 7 g/dL (ref 6.5–8.1)

## 2018-11-25 LAB — PROTIME-INR
INR: 1.18
Prothrombin Time: 14.9 seconds (ref 11.4–15.2)

## 2018-11-25 LAB — LACTIC ACID, PLASMA
Lactic Acid, Venous: 2.5 mmol/L (ref 0.5–1.9)
Lactic Acid, Venous: 2.5 mmol/L (ref 0.5–1.9)

## 2018-11-25 LAB — MAGNESIUM: Magnesium: 1.2 mg/dL — ABNORMAL LOW (ref 1.7–2.4)

## 2018-11-25 LAB — LIPASE, BLOOD: Lipase: 293 U/L — ABNORMAL HIGH (ref 11–51)

## 2018-11-25 LAB — PROCALCITONIN: Procalcitonin: 3.9 ng/mL

## 2018-11-25 LAB — PHOSPHORUS: Phosphorus: 2.2 mg/dL — ABNORMAL LOW (ref 2.5–4.6)

## 2018-11-25 MED ORDER — LACTATED RINGERS IV BOLUS
1000.0000 mL | Freq: Once | INTRAVENOUS | Status: AC
  Administered 2018-11-25: 1000 mL via INTRAVENOUS

## 2018-11-25 MED ORDER — LORAZEPAM 2 MG/ML IJ SOLN
1.0000 mg | Freq: Once | INTRAMUSCULAR | Status: AC
  Administered 2018-11-25: 1 mg via INTRAVENOUS
  Filled 2018-11-25: qty 1

## 2018-11-25 MED ORDER — METOPROLOL TARTRATE 25 MG PO TABS
25.0000 mg | ORAL_TABLET | Freq: Two times a day (BID) | ORAL | Status: DC
  Administered 2018-11-25 – 2018-11-26 (×2): 25 mg via ORAL
  Filled 2018-11-25 (×3): qty 1

## 2018-11-25 MED ORDER — AMLODIPINE BESYLATE 5 MG PO TABS
5.0000 mg | ORAL_TABLET | Freq: Every day | ORAL | Status: DC
  Administered 2018-11-25 – 2018-11-29 (×5): 5 mg via ORAL
  Filled 2018-11-25 (×5): qty 1

## 2018-11-25 MED ORDER — METOPROLOL TARTRATE 5 MG/5ML IV SOLN
5.0000 mg | INTRAVENOUS | Status: DC | PRN
  Administered 2018-11-25 – 2018-11-26 (×7): 5 mg via INTRAVENOUS
  Filled 2018-11-25 (×7): qty 5

## 2018-11-25 MED ORDER — FLEET ENEMA 7-19 GM/118ML RE ENEM: 1.0000 | ENEMA | Freq: Every day | RECTAL | Status: DC | PRN

## 2018-11-25 MED ORDER — POTASSIUM PHOSPHATES 15 MMOLE/5ML IV SOLN
20.0000 mmol | Freq: Once | INTRAVENOUS | Status: AC
  Administered 2018-11-25: 20 mmol via INTRAVENOUS
  Filled 2018-11-25: qty 6.67

## 2018-11-25 MED ORDER — HYDRALAZINE HCL 20 MG/ML IJ SOLN
10.0000 mg | INTRAMUSCULAR | Status: DC | PRN
  Administered 2018-11-25 – 2018-11-29 (×3): 10 mg via INTRAVENOUS
  Filled 2018-11-25 (×3): qty 1

## 2018-11-25 MED ORDER — LEVALBUTEROL HCL 0.63 MG/3ML IN NEBU
0.6300 mg | INHALATION_SOLUTION | Freq: Four times a day (QID) | RESPIRATORY_TRACT | Status: DC | PRN
  Administered 2018-11-27: 0.63 mg via RESPIRATORY_TRACT
  Filled 2018-11-25 (×2): qty 3

## 2018-11-25 MED ORDER — POTASSIUM PHOSPHATES 15 MMOLE/5ML IV SOLN
20.0000 mmol | Freq: Once | INTRAVENOUS | Status: DC
  Filled 2018-11-25: qty 6.67

## 2018-11-25 MED ORDER — PIPERACILLIN-TAZOBACTAM 3.375 G IVPB
3.3750 g | Freq: Three times a day (TID) | INTRAVENOUS | Status: DC
  Administered 2018-11-25 – 2018-11-27 (×5): 3.375 g via INTRAVENOUS
  Filled 2018-11-25 (×5): qty 50

## 2018-11-25 MED ORDER — MAGNESIUM SULFATE 4 GM/100ML IV SOLN
4.0000 g | Freq: Once | INTRAVENOUS | Status: AC
  Administered 2018-11-25: 4 g via INTRAVENOUS
  Filled 2018-11-25: qty 100

## 2018-11-25 MED ORDER — METOPROLOL TARTRATE 5 MG/5ML IV SOLN
5.0000 mg | Freq: Four times a day (QID) | INTRAVENOUS | Status: DC | PRN
  Administered 2018-11-25 – 2018-11-27 (×5): 5 mg via INTRAVENOUS
  Filled 2018-11-25 (×5): qty 5

## 2018-11-25 MED ORDER — METOPROLOL TARTRATE 5 MG/5ML IV SOLN
10.0000 mg | Freq: Once | INTRAVENOUS | Status: AC
  Administered 2018-11-25: 10 mg via INTRAVENOUS
  Filled 2018-11-25: qty 10

## 2018-11-25 MED ORDER — SODIUM CHLORIDE 0.9 % IV BOLUS
1000.0000 mL | Freq: Once | INTRAVENOUS | Status: AC
  Administered 2018-11-25: 1000 mL via INTRAVENOUS

## 2018-11-25 MED ORDER — PIPERACILLIN-TAZOBACTAM 3.375 G IVPB
3.3750 g | Freq: Three times a day (TID) | INTRAVENOUS | Status: DC
  Administered 2018-11-25: 3.375 g via INTRAVENOUS
  Filled 2018-11-25 (×6): qty 50

## 2018-11-25 MED ORDER — KETOROLAC TROMETHAMINE 30 MG/ML IJ SOLN
30.0000 mg | Freq: Once | INTRAMUSCULAR | Status: AC
  Administered 2018-11-25: 30 mg via INTRAVENOUS
  Filled 2018-11-25: qty 1

## 2018-11-25 MED ORDER — BISACODYL 10 MG RE SUPP: 10.0000 mg | Freq: Every day | RECTAL | Status: DC | PRN

## 2018-11-25 MED ORDER — LEVALBUTEROL HCL 0.63 MG/3ML IN NEBU
INHALATION_SOLUTION | RESPIRATORY_TRACT | Status: AC
  Administered 2018-11-25: 0.63 mg
  Filled 2018-11-25: qty 3

## 2018-11-25 NOTE — Progress Notes (Addendum)
Night shift hospitalist floor coverage note.  The staff reported that the patient's blood pressure was 186/115 mmHg on the last measurement. He is also having tachycardia in the 120s. Earlier, he received hydralazine 10 mg IVP without significant effect on his BP. The patient was placed on telemetry and metoprolol 5 mg IVP was ordered. Will obtain labs earlier to check his progress.  He has also been febrile since earlier in the evening.  Most recent vital signs temperature 100.3 F, pulse 122, blood pressure 186/115 mmHg and O2 sat 97% on room air.  He states that he is still having pain, but not as intense and his nausea is better.  He is complaining of constipation and states that his last bowel movement was several days ago.  He denies dyspnea, productive cough, dysuria, frequency or hematuria.  General: Mildly febrile, but otherwise in NAD. HEENT: Normocephalic, oromucosa is mildly dry. Neck: Supple, no JVD. Lungs: CTA bilaterally. CV: S1, S2, tachycardic at 116 bpm with a regular rhythm, no murmurs Abdomen: Distended, bowel sounds positive,  tense, positive epigastric tenderness without guarding or rebound. Extremities: No edema, clubbing or cyanosis Neuro: AAO x4, grossly nonfocal.  CT showed significant peripancreatic fluid and free fluid within the abdomen and pelvis.  Blood cultures x2 have been ordered. Will obtain a schedule morning labs earlier. Check also lactic acid, magnesium and phosphorus level.  Fever in a patient with acute pancreatitis As above. LR 1000 mL IV bolus now. Will start Zosyn 3.375 g every 8 hours IVPB. Toradol for fever and pain.  Hypertension Will switch hydralazine to metoprolol. Cardiac telemetry for 12 to 24 hours given tachycardia.  Alcohol dependence Continue CIWA protocol. Supplement magnesium. Daily thiamine, MVI and folate.  Sanda Kleinavid Makenzie Weisner, MD.  About 35 minutes of being used during the process of this critical care event.  This document  was prepared using Dragon voice recognition software and may contain some unintended transcription errors.

## 2018-11-25 NOTE — Progress Notes (Signed)
Night shift hospitalist floor coverage note addendum:  Lab results:  Patient's white count is 13.1, hemoglobin 14.6 g/dL and platelets [id number].  PT and INR were normal.  Magnesium was 1.2 and phosphorus 2.2 mg/dL.  CMP shows a CO2 19 mmol/L.  Normal renal function.  Glucose is 168 mg/dL.  Lipase has decreased to 293 units/L and both transaminases were quasi normalized.  However, calcium level is down 7.8 mg/dL.  Lactic acid was 2.5 mmol/L.  He has received 1 L of LR bolus.  I will administer of another 1000 mL of NS bolus.  Lactic acid and BMP (CO2, anion gap and calcium level) to be followed around 0800.  Will initiate phosphorus replacement later today when IV formulation is available.  Sanda Kleinavid Amayrani Bennick, MD.

## 2018-11-25 NOTE — Care Management Note (Addendum)
Case Management Note  Patient Details  Name: Matthew Moody MRN: [id number] Date of Birth: 1971/11/12  Subjective/Objective:    Acute alcoholic pancreatitis. CM consulted for medications and PCP. Discussed during progression rounds with attending. At this time, patient will not need a MATCH for BP meds, anticipate being prescribed affordable medications. CM will make appt for patient with [facility] at time of DC.           Action/Plan: Will make an appt with [facility] to help with PCP establishment.   11/27/2018: Appt made with [facility] on Monday 11/30/2018 at 3 pm. Patient updated. Flyer given with [facility] information.   Expected Discharge Date:    11/30/2018           Expected Discharge Plan:  Home/Self Care  In-House Referral:     Discharge planning Services  CM Consult, Follow-up appt scheduled, Medication Assistance  Post Acute Care Choice:    Choice offered to:     DME Arranged:    DME Agency:     HH Arranged:    HH Agency:     Status of Service:  Completed. If discussed at Long Length of Stay Meetings, dates discussed:    Additional Comments:  Samanda Buske, Chrystine OilerSharley Diane, RN 11/25/2018, 12:21 PM

## 2018-11-25 NOTE — Progress Notes (Signed)
PROGRESS NOTE    Matthew Moody  ZOX:[id number] DOB: July 04, 1971 DOA: 11/24/2018 PCP: Patient, No Pcp Per   Brief Narrative:  Per HPI form Dr. Gonzella Lexhungel:  Matthew Moody  is a 47 y.o. male, with history of ongoing alcohol abuse, alcoholic pancreatitis, hospitalized in January 2019 presented to the ED with 2 days history of progressive epigastric pain radiating to the sides, nausea with nonbilious, nonbloody vomiting and poor p.o. intake.  Reports his last drink to be on the day of Thanksgiving (5 days ago).  He normally drinks about one half to full pint of vodka daily.  He reports having several episodes of vomiting and since yesterday also having some loose bowel movements. He denies any hematemesis, melena, bright red blood per rectum.  Reports some headache but denies any blurred vision, confusion, hallucinations or tremors.  Denies any other medications besides multivitamin.  Denies chest pain, shortness of breath, palpitation, fevers or chills.  No recent travel or sick contact.  Patient has been admitted with acute pancreatitis and appears to be doing better this morning.  He denies any nausea or vomiting and has been tolerating clear liquids.   Assessment & Plan:   Principal Problem:   Acute alcoholic pancreatitis Active Problems:   Pancreatitis, recurrent   Accelerated hypertension   Alcohol dependence (HCC)   Impaired glucose tolerance   Acute on chronic pancreatitis (HCC)  1. Acute, recurrent alcoholic pancreatitis.  Advance diet as tolerated to full liquids today.  Lipase and LFTs appear to be downtrending.  Counseled on alcohol cessation. 2. Accelerated hypertension.  Continues to have elevated blood pressure readings.  Will start IV hydralazine as needed along with amlodipine 5 mg daily. 3. Chronic alcohol abuse.  Maintain on CIWA protocol.  No active withdrawal symptoms currently noted.  Counseled on cessation. 4. Impaired glucose tolerance.  Hemoglobin A1c is 5.8%.  Follow-up  outpatient.  DVT prophylaxis: Lovenox Code Status: Full code Family Communication: None at bedside Disposition Plan: Continue diet advancement, and if stable by a.m. consider discharge   Consultants:   None  Procedures:   None  Antimicrobials:   Zosyn 12/4-12/4   Subjective: Patient seen and evaluated today with no new acute complaints or concerns. No acute concerns or events noted overnight.  He denies any further nausea or vomiting symptoms and does not have any significant pain.  Objective: Vitals:   11/25/18 0009 11/25/18 0123 11/25/18 0420 11/25/18 0557  BP: (!) 177/117 (!) 186/115 (!) 165/109 (!) 166/116  Pulse: (!) 115 (!) 122 (!) 126 (!) 130  Resp:      Temp: 100.3 F (37.9 C)  99.5 F (37.5 C) 99.3 F (37.4 C)  TempSrc: Oral  Oral Oral  SpO2: 97%  97% 96%  Weight:      Height:        Intake/Output Summary (Last 24 hours) at 11/25/2018 1308 Last data filed at 11/25/2018 0500 Gross per 24 hour  Intake 3370.08 ml  Output -  Net 3370.08 ml   Filed Weights   11/24/18 1055  Weight: 93 kg    Examination:  General exam: Appears calm and comfortable  Respiratory system: Clear to auscultation. Respiratory effort normal. Cardiovascular system: S1 & S2 heard, RRR. No JVD, murmurs, rubs, gallops or clicks. No pedal edema. Gastrointestinal system: Abdomen is nondistended, soft and nontender. No organomegaly or masses felt. Normal bowel sounds heard. Central nervous system: Alert and oriented. No focal neurological deficits. Extremities: Symmetric 5 x 5 power. Skin: No rashes, lesions or  ulcers Psychiatry: Judgement and insight appear normal. Mood & affect appropriate.     Data Reviewed: I have personally reviewed following labs and imaging studies  CBC: Recent Labs  Lab 11/24/18 1112 11/25/18 0251  WBC 12.8* 13.1*  NEUTROABS 11.7*  --   HGB 15.6 14.6  HCT 45.7 42.8  MCV 94.2 95.5  PLT 235 191   Basic Metabolic Panel: Recent Labs  Lab  11/24/18 1106 11/25/18 0251  NA 138 136  K 3.9 3.7  CL 98 104  CO2 24 19*  GLUCOSE 195* 168*  BUN 10 10  CREATININE 1.14 0.90  CALCIUM 9.2 7.8*  MG  --  1.2*  PHOS  --  2.2*   GFR: Estimated Creatinine Clearance: 114.6 mL/min (by C-G formula based on SCr of 0.9 mg/dL). Liver Function Tests: Recent Labs  Lab 11/24/18 1106 11/25/18 0251  AST 53* 49*  ALT 51* 35  ALKPHOS 57 44  BILITOT 1.6* 1.1  PROT 8.2* 7.0  ALBUMIN 4.6 3.7   Recent Labs  Lab 11/24/18 1106 11/25/18 0251  LIPASE 1,086* 293*   No results for input(s): AMMONIA in the last 168 hours. Coagulation Profile: Recent Labs  Lab 11/25/18 0251  INR 1.18   Cardiac Enzymes: No results for input(s): CKTOTAL, CKMB, CKMBINDEX, TROPONINI in the last 168 hours. BNP (last 3 results) No results for input(s): PROBNP in the last 8760 hours. HbA1C: Recent Labs    11/24/18 1107  HGBA1C 5.8*   CBG: No results for input(s): GLUCAP in the last 168 hours. Lipid Profile: No results for input(s): CHOL, HDL, LDLCALC, TRIG, CHOLHDL, LDLDIRECT in the last 72 hours. Thyroid Function Tests: No results for input(s): TSH, T4TOTAL, FREET4, T3FREE, THYROIDAB in the last 72 hours. Anemia Panel: No results for input(s): VITAMINB12, FOLATE, FERRITIN, TIBC, IRON, RETICCTPCT in the last 72 hours. Sepsis Labs: Recent Labs  Lab 11/25/18 0251 11/25/18 0958  LATICACIDVEN 2.5* 2.5*    Recent Results (from the past 240 hour(s))  Culture, blood (x 2)     Status: None (Preliminary result)   Collection Time: 11/25/18  2:53 AM  Result Value Ref Range Status   Specimen Description BLOOD RIGHT ANTECUBITAL  Final   Special Requests   Final    BOTTLES DRAWN AEROBIC AND ANAEROBIC Blood Culture results may not be optimal due to an excessive volume of blood received in culture bottles   Culture   Final    NO GROWTH < 12 HOURS Performed at [facility] [address], [place], [place] [address]    Report Status PENDING  Incomplete   Culture, blood (x 2)     Status: None (Preliminary result)   Collection Time: 11/25/18  3:01 AM  Result Value Ref Range Status   Specimen Description BLOOD LEFT FOREARM  Final   Special Requests   Final    BOTTLES DRAWN AEROBIC AND ANAEROBIC Blood Culture adequate volume   Culture   Final    NO GROWTH < 12 HOURS Performed at [facility], [address], [place], [place] [address]    Report Status PENDING  Incomplete         Radiology Studies: Dg Chest 2 View  Result Date: 11/24/2018 CLINICAL DATA:  Abdominal pain.  Nausea, vomiting, diarrhea. EXAM: CHEST - 2 VIEW COMPARISON:  Chest x-ray 05/03/2018. FINDINGS: Mediastinum and hilar structures normal. Low lung volumes. No focal alveolar infiltrate. No pleural effusion or pneumothorax. No acute bony abnormality. IMPRESSION: No acute cardiopulmonary disease. Electronically Signed   By: Maisie Fus  Register   On: 11/24/2018 11:48   Ct Abdomen Pelvis W Contrast  Result Date: 11/24/2018 CLINICAL DATA:  Abdominal pain for 2 days EXAM: CT ABDOMEN AND PELVIS WITH CONTRAST TECHNIQUE: Multidetector CT imaging of the abdomen and pelvis was performed using the standard protocol following bolus administration of intravenous contrast. CONTRAST:  ISOVUE-300 IOPAMIDOL (ISOVUE-300) INJECTION 61% COMPARISON:  05/03/2018 FINDINGS: Lower chest: No acute abnormality. Hepatobiliary: Liver is diffusely fatty infiltrated. Minimal perihepatic fluid is noted. The gallbladder is unremarkable. Pancreas: The pancreas is well visualized and demonstrates a normal enhancement pattern. Considerable peripancreatic inflammatory change is noted consistent with acute pancreatitis. The overall appearance is increased when compared with the prior exam with more fluid along the anterior aspect of Gerota's fascia bilaterally and free fluid within the pelvis. Spleen: Normal in size without focal abnormality. Adrenals/Urinary Tract: Adrenal glands are within normal limits. Kidneys  are well visualize without evidence of renal calculi or obstructive changes. The bladder is decompressed. Stomach/Bowel: The appendix is within normal limits. No obstructive or inflammatory changes in the bowel are seen. Vascular/Lymphatic: No significant vascular findings are present. No enlarged abdominal or pelvic lymph nodes. Reproductive: Prostate is unremarkable. Other: Free fluid is noted within the abdomen and pelvis related to the pancreatitis. No hernia is identified. Musculoskeletal: Mild degenerative changes of lumbar spine are noted. IMPRESSION: Changes consistent with acute pancreatitis with significant peripancreatic fluid and free fluid within the abdomen and pelvis. Fatty infiltration of the liver. No other focal abnormality is noted. Electronically Signed   By: Alcide Clever M.D.   On: 11/24/2018 12:43        Scheduled Meds: . enoxaparin (LOVENOX) injection  40 mg Subcutaneous Q24H  . folic acid  1 mg Oral Daily  . Influenza vac split quadrivalent PF  0.5 mL Intramuscular Tomorrow-1000  . multivitamin with minerals  1 tablet Oral Daily  . thiamine  100 mg Oral Daily   Or  . thiamine  100 mg Intravenous Daily   Continuous Infusions: . 0.9 % NaCl with KCl 20 mEq / L 150 mL/hr at 11/25/18 0912  . famotidine (PEPCID) IV 20 mg (11/25/18 0914)  . lactated ringers    . potassium PHOSPHATE IVPB (in mmol) 20 mmol (11/25/18 1018)     LOS: 1 day    Time spent: 30 minutes    Tashena Ibach Hoover Brunette, DO [facility] Pager [phone]  If 7PM-7AM, please contact night-coverage [email or web address] Password [email or web address] 11/25/2018, 1:08 PM

## 2018-11-25 NOTE — Progress Notes (Signed)
CRITICAL VALUE ALERT  Critical Value: lactic acid 2.5  Date & Time Notied:  1035 11/25/18  Provider Notified: Dr.Shah  Orders Received/Actions taken:

## 2018-11-25 NOTE — Progress Notes (Signed)
Pharmacy Antibiotic Note  Matthew Moody is a 47 y.o. male admitted on 11/24/2018 with sepsis.  Pharmacy has been consulted for Zosyn dosing.  Plan: Zosyn 3.375g IV q8h (4 hour infusion).  Monitor labs, c/s, and patient improvement.  Height: 5\' 8"  (172.7 cm) Weight: 205 lb (93 kg) IBW/kg (Calculated) : 68.4  Temp (24hrs), Avg:100.2 F (37.9 C), Min:99.2 F (37.3 C), Max:102.2 F (39 C)  Recent Labs  Lab 11/24/18 1106 11/24/18 1112 11/25/18 0251 11/25/18 0958  WBC  --  12.8* 13.1*  --   CREATININE 1.14  --  0.90  --   LATICACIDVEN  --   --  2.5* 2.5*    Estimated Creatinine Clearance: 114.6 mL/min (by C-G formula based on SCr of 0.9 mg/dL).    No Known Allergies  Antimicrobials this admission: Zosyn 12/4 >>      Dose adjustments this admission: N/A  Microbiology results: 12/4 BCx: pending   Thank you for allowing pharmacy to be a part of this patient's care.  Tad MooreSteven C Gulianna Hornsby 11/25/2018 5:28 PM

## 2018-11-26 LAB — COMPREHENSIVE METABOLIC PANEL
ALK PHOS: 43 U/L (ref 38–126)
ALT: 23 U/L (ref 0–44)
AST: 30 U/L (ref 15–41)
Albumin: 2.8 g/dL — ABNORMAL LOW (ref 3.5–5.0)
Anion gap: 9 (ref 5–15)
BILIRUBIN TOTAL: 0.8 mg/dL (ref 0.3–1.2)
BUN: 10 mg/dL (ref 6–20)
CO2: 22 mmol/L (ref 22–32)
Calcium: 7.2 mg/dL — ABNORMAL LOW (ref 8.9–10.3)
Chloride: 105 mmol/L (ref 98–111)
Creatinine, Ser: 0.85 mg/dL (ref 0.61–1.24)
GFR calc Af Amer: >60 mL/min (ref >60)
GFR calc non Af Amer: >60 mL/min (ref >60)
Glucose, Bld: 151 mg/dL — ABNORMAL HIGH (ref 70–99)
Potassium: 3.8 mmol/L (ref 3.5–5.1)
Sodium: 136 mmol/L (ref 135–145)
TOTAL PROTEIN: 6.3 g/dL — AB (ref 6.5–8.1)

## 2018-11-26 LAB — CBC
HCT: 37.4 % — ABNORMAL LOW (ref 39.0–52.0)
Hemoglobin: 12.2 g/dL — ABNORMAL LOW (ref 13.0–17.0)
MCH: 32 pg (ref 26.0–34.0)
MCHC: 32.6 g/dL (ref 30.0–36.0)
MCV: 98.2 fL (ref 80.0–100.0)
Platelets: 134 10*3/uL — ABNORMAL LOW (ref 150–400)
RBC: 3.81 MIL/uL — AB (ref 4.22–5.81)
RDW: 15.9 % — ABNORMAL HIGH (ref 11.5–15.5)
WBC: 8.8 10*3/uL (ref 4.0–10.5)
nRBC: 0.2 % (ref 0.0–0.2)

## 2018-11-26 LAB — MAGNESIUM: Magnesium: 2.2 mg/dL (ref 1.7–2.4)

## 2018-11-26 LAB — LACTIC ACID, PLASMA: Lactic Acid, Venous: 1.7 mmol/L (ref 0.5–1.9)

## 2018-11-26 LAB — PROCALCITONIN: Procalcitonin: 4.85 ng/mL

## 2018-11-26 MED ORDER — LORAZEPAM 2 MG/ML IJ SOLN
2.0000 mg | Freq: Once | INTRAMUSCULAR | Status: AC
  Administered 2018-11-26: 2 mg via INTRAVENOUS
  Filled 2018-11-26: qty 1

## 2018-11-26 MED ORDER — LORAZEPAM 2 MG/ML IJ SOLN
1.0000 mg | INTRAMUSCULAR | Status: DC | PRN
  Administered 2018-11-26 – 2018-11-28 (×6): 1 mg via INTRAVENOUS
  Filled 2018-11-26 (×6): qty 1

## 2018-11-26 MED ORDER — IPRATROPIUM-ALBUTEROL 0.5-2.5 (3) MG/3ML IN SOLN
3.0000 mL | Freq: Four times a day (QID) | RESPIRATORY_TRACT | Status: DC | PRN
  Administered 2018-11-26: 3 mL via RESPIRATORY_TRACT
  Filled 2018-11-26: qty 3

## 2018-11-26 MED ORDER — LORAZEPAM 1 MG PO TABS: 1.0000 mg | ORAL_TABLET | ORAL | Status: DC | PRN

## 2018-11-26 MED ORDER — CLONIDINE HCL 0.2 MG PO TABS
0.2000 mg | ORAL_TABLET | Freq: Once | ORAL | Status: AC
  Administered 2018-11-26: 0.2 mg via ORAL
  Filled 2018-11-26: qty 1

## 2018-11-26 NOTE — Progress Notes (Signed)
PROGRESS NOTE    Matthew Moody  ZOX:[id number] DOB: [birth year] DOA: 11/24/2018 PCP: Patient, No Pcp Per   Brief Narrative:  Per HPI form Dr. Gonzella LexhungelAlcus Dad: KevinNobleis a47 y.o.male,with history of ongoing alcohol abuse, alcoholic pancreatitis, hospitalized in January 2019 presented to the ED with 2 days history of progressive epigastric pain radiating to the sides, nausea with nonbilious, nonbloody vomiting and poor p.o. intake. Reports his last drink to be on the day of Thanksgiving (5 days ago). He normally drinks about one half to full pint of vodka daily. He reports having several episodes of vomiting and since yesterday also having some loose bowel movements. He denies any hematemesis, melena, bright red blood per rectum. Reports some headache but denies any blurred vision, confusion, hallucinations or tremors. Denies any other medications besides multivitamin. Denies chest pain, shortness of breath, palpitation, fevers or chills. No recent travel or sick contact.  Patient has been admitted with acute pancreatitis and continues to have some nausea and vomiting and appears to be struggling with DTs.   Assessment & Plan:   Principal Problem:   Acute alcoholic pancreatitis Active Problems:   Pancreatitis, recurrent   Accelerated hypertension   Alcohol dependence (HCC)   Impaired glucose tolerance   Acute on chronic pancreatitis (HCC)  1. Acute, recurrent alcoholic pancreatitis.    Scale diet back to clear liquids today on account of nausea and vomiting.  Lipase and LFTs appear to be downtrending.  Counseled on alcohol cessation.  2. Delirium tremens.  Maintain on CIWA protocol and increase Ativan frequency to every 4 hours.  If he continues to have persistent symptoms, may need to move to ICU for Precedex drip.  Monitor closely. 3. Lactic acidosis-improving.  Patient did have some sirs criteria with initial signs of sepsis yesterday for which Zosyn was started.  Continue for  now as procalcitonin is elevated and monitor blood cultures.  Consider discontinuation if blood cultures remain negative x48 hours and no other signs of infection noted. 4. Accelerated hypertension-improving.  Continues to have elevated blood pressure readings likely related to poorly controlled hypertension as well as DVTs.    Continue on amlodipine as well as metoprolol as ordered along with IV pushes of hydralazine as needed. 5. Hypomagnesemia.  Repleted yesterday.  Recheck today and replete further as needed. 6. Worsening thrombocytopenia.  Change Lovenox to SCDs today and continue to monitor with repeat CBC. 7. Chronic alcohol abuse.  Maintain on CIWA protocol.  No active withdrawal symptoms currently noted.  Counseled on cessation. 8. Impaired glucose tolerance.  Hemoglobin A1c is 5.8%.  Follow-up outpatient.  DVT prophylaxis: Lovenox to SCDs Code Status: Full code Family Communication: None at bedside Disposition Plan: Continue diet advancement by tomorrow if tolerating and monitor for further signs of DTs.  Consider discontinuation of Zosyn if no signs of infection noted.  Blood pressure and heart rate control.   Consultants:   None  Procedures:   None  Antimicrobials:   Zosyn 12/4-12/4  Zosyn 12/4->  Subjective: Patient seen and evaluated today with some ongoing nausea and vomiting as well as tachycardia, diaphoresis, and some mild visual hallucinations per wife at bedside.  He denies any abdominal pain and appears to have tolerated some of his full liquid diet yesterday.  Objective: Vitals:   11/26/18 0000 11/26/18 0541 11/26/18 0611 11/26/18 0830  BP: (!) 165/104 (!) 148/100 (!) 158/97 (!) 129/95  Pulse: 95 98 (!) 130 (!) 126  Resp:   20 20  Temp:   98.8 F (  37.1 C) 98.9 F (37.2 C)  TempSrc:   Oral Oral  SpO2:   98%   Weight:      Height:        Intake/Output Summary (Last 24 hours) at 11/26/2018 0857 Last data filed at 11/25/2018 2155 Gross per 24 hour    Intake 865.6 ml  Output 1500 ml  Net -634.4 ml   Filed Weights   11/24/18 1055  Weight: 93 kg    Examination:  General exam: Restless and agitated Respiratory system: Clear to auscultation. Respiratory effort normal.  Currently on nasal cannula. Cardiovascular system: S1 & S2 heard, RRR. No JVD, murmurs, rubs, gallops or clicks. No pedal edema. Gastrointestinal system: Abdomen is nondistended, soft and nontender. No organomegaly or masses felt. Normal bowel sounds heard. Central nervous system: Alert and awake.  Questionable orientation. Extremities: Symmetric 5 x 5 power. Skin: No rashes, lesions or ulcers Psychiatry: Cannot be appropriately assessed.    Data Reviewed: I have personally reviewed following labs and imaging studies  CBC: Recent Labs  Lab 11/24/18 1112 11/25/18 0251 11/26/18 0452  WBC 12.8* 13.1* 8.8  NEUTROABS 11.7*  --   --   HGB 15.6 14.6 12.2*  HCT 45.7 42.8 37.4*  MCV 94.2 95.5 98.2  PLT 235 191 134*   Basic Metabolic Panel: Recent Labs  Lab 11/24/18 1106 11/25/18 0251 11/26/18 0452  NA 138 136 136  K 3.9 3.7 3.8  CL 98 104 105  CO2 24 19* 22  GLUCOSE 195* 168* 151*  BUN 10 10 10   CREATININE 1.14 0.90 0.85  CALCIUM 9.2 7.8* 7.2*  MG  --  1.2*  --   PHOS  --  2.2*  --    GFR: Estimated Creatinine Clearance: 121.4 mL/min (by C-G formula based on SCr of 0.85 mg/dL). Liver Function Tests: Recent Labs  Lab 11/24/18 1106 11/25/18 0251 11/26/18 0452  AST 53* 49* 30  ALT 51* 35 23  ALKPHOS 57 44 43  BILITOT 1.6* 1.1 0.8  PROT 8.2* 7.0 6.3*  ALBUMIN 4.6 3.7 2.8*   Recent Labs  Lab 11/24/18 1106 11/25/18 0251  LIPASE 1,086* 293*   No results for input(s): AMMONIA in the last 168 hours. Coagulation Profile: Recent Labs  Lab 11/25/18 0251  INR 1.18   Cardiac Enzymes: No results for input(s): CKTOTAL, CKMB, CKMBINDEX, TROPONINI in the last 168 hours. BNP (last 3 results) No results for input(s): PROBNP in the last 8760  hours. HbA1C: Recent Labs    11/24/18 1107  HGBA1C 5.8*   CBG: No results for input(s): GLUCAP in the last 168 hours. Lipid Profile: No results for input(s): CHOL, HDL, LDLCALC, TRIG, CHOLHDL, LDLDIRECT in the last 72 hours. Thyroid Function Tests: No results for input(s): TSH, T4TOTAL, FREET4, T3FREE, THYROIDAB in the last 72 hours. Anemia Panel: No results for input(s): VITAMINB12, FOLATE, FERRITIN, TIBC, IRON, RETICCTPCT in the last 72 hours. Sepsis Labs: Recent Labs  Lab 11/25/18 0251 11/25/18 0958 11/25/18 1727 11/26/18 0452  PROCALCITON  --   --  3.90 4.85  LATICACIDVEN 2.5* 2.5*  --  1.7    Recent Results (from the past 240 hour(s))  Culture, blood (x 2)     Status: None (Preliminary result)   Collection Time: 11/25/18  2:53 AM  Result Value Ref Range Status   Specimen Description BLOOD RIGHT ANTECUBITAL  Final   Special Requests   Final    BOTTLES DRAWN AEROBIC AND ANAEROBIC Blood Culture results may not be optimal due to an  excessive volume of blood received in culture bottles   Culture   Final    NO GROWTH 1 DAY Performed at [facility], [address], [place], [place] [address]    Report Status PENDING  Incomplete  Culture, blood (x 2)     Status: None (Preliminary result)   Collection Time: 11/25/18  3:01 AM  Result Value Ref Range Status   Specimen Description BLOOD LEFT FOREARM  Final   Special Requests   Final    BOTTLES DRAWN AEROBIC AND ANAEROBIC Blood Culture adequate volume   Culture   Final    NO GROWTH 1 DAY Performed at [facility], [address] [place], [place] [address]    Report Status PENDING  Incomplete         Radiology Studies: Dg Chest 2 View  Result Date: 11/24/2018 CLINICAL DATA:  Abdominal pain.  Nausea, vomiting, diarrhea. EXAM: CHEST - 2 VIEW COMPARISON:  Chest x-ray 05/03/2018. FINDINGS: Mediastinum and hilar structures normal. Low lung volumes. No focal alveolar infiltrate. No pleural effusion or pneumothorax. No acute  bony abnormality. IMPRESSION: No acute cardiopulmonary disease. Electronically Signed   By: Maisie Fus  Register   On: 11/24/2018 11:48   Ct Abdomen Pelvis W Contrast  Result Date: 11/24/2018 CLINICAL DATA:  Abdominal pain for 2 days EXAM: CT ABDOMEN AND PELVIS WITH CONTRAST TECHNIQUE: Multidetector CT imaging of the abdomen and pelvis was performed using the standard protocol following bolus administration of intravenous contrast. CONTRAST:  ISOVUE-300 IOPAMIDOL (ISOVUE-300) INJECTION 61% COMPARISON:  05/03/2018 FINDINGS: Lower chest: No acute abnormality. Hepatobiliary: Liver is diffusely fatty infiltrated. Minimal perihepatic fluid is noted. The gallbladder is unremarkable. Pancreas: The pancreas is well visualized and demonstrates a normal enhancement pattern. Considerable peripancreatic inflammatory change is noted consistent with acute pancreatitis. The overall appearance is increased when compared with the prior exam with more fluid along the anterior aspect of Gerota's fascia bilaterally and free fluid within the pelvis. Spleen: Normal in size without focal abnormality. Adrenals/Urinary Tract: Adrenal glands are within normal limits. Kidneys are well visualize without evidence of renal calculi or obstructive changes. The bladder is decompressed. Stomach/Bowel: The appendix is within normal limits. No obstructive or inflammatory changes in the bowel are seen. Vascular/Lymphatic: No significant vascular findings are present. No enlarged abdominal or pelvic lymph nodes. Reproductive: Prostate is unremarkable. Other: Free fluid is noted within the abdomen and pelvis related to the pancreatitis. No hernia is identified. Musculoskeletal: Mild degenerative changes of lumbar spine are noted. IMPRESSION: Changes consistent with acute pancreatitis with significant peripancreatic fluid and free fluid within the abdomen and pelvis. Fatty infiltration of the liver. No other focal abnormality is noted.  Electronically Signed   By: Alcide Clever M.D.   On: 11/24/2018 12:43        Scheduled Meds: . amLODipine  5 mg Oral Daily  . enoxaparin (LOVENOX) injection  40 mg Subcutaneous Q24H  . folic acid  1 mg Oral Daily  . Influenza vac split quadrivalent PF  0.5 mL Intramuscular Tomorrow-1000  . metoprolol tartrate  25 mg Oral BID  . multivitamin with minerals  1 tablet Oral Daily  . thiamine  100 mg Oral Daily   Or  . thiamine  100 mg Intravenous Daily   Continuous Infusions: . 0.9 % NaCl with KCl 20 mEq / L 150 mL/hr at 11/25/18 2342  . famotidine (PEPCID) IV 20 mg (11/25/18 2153)  . piperacillin-tazobactam 3.375 g (11/26/18 0220)     LOS: 2 days  Time spent: 30 minutes    Brannon Decaire Hoover Brunette, DO [facility] Pager [phone]  If 7PM-7AM, please contact night-coverage [email or web address] Password [email or web address] 11/26/2018, 8:57 AM

## 2018-11-26 NOTE — Progress Notes (Signed)
Patient continued to be confused and increasing in activity. Family no longer at bedside. Patient continued to get out of the bed and also pulled out IV. Call Dr. Jeannette Howritz and informed him of increased withdrawal symptoms. Came to see patient and made order changes. Carried out orders. Will continue to monitor.

## 2018-11-27 DIAGNOSIS — R6510 Systemic inflammatory response syndrome (SIRS) of non-infectious origin without acute organ dysfunction: Secondary | ICD-10-CM

## 2018-11-27 DIAGNOSIS — R7302 Impaired glucose tolerance (oral): Secondary | ICD-10-CM

## 2018-11-27 LAB — COMPREHENSIVE METABOLIC PANEL
ALT: 24 U/L (ref 0–44)
ANION GAP: 8 (ref 5–15)
AST: 40 U/L (ref 15–41)
Albumin: 2.8 g/dL — ABNORMAL LOW (ref 3.5–5.0)
Alkaline Phosphatase: 53 U/L (ref 38–126)
BUN: 8 mg/dL (ref 6–20)
CO2: 24 mmol/L (ref 22–32)
Calcium: 7.6 mg/dL — ABNORMAL LOW (ref 8.9–10.3)
Chloride: 103 mmol/L (ref 98–111)
Creatinine, Ser: 0.72 mg/dL (ref 0.61–1.24)
GFR calc Af Amer: >60 mL/min (ref >60)
GFR calc non Af Amer: >60 mL/min (ref >60)
Glucose, Bld: 136 mg/dL — ABNORMAL HIGH (ref 70–99)
Potassium: 3.6 mmol/L (ref 3.5–5.1)
Sodium: 135 mmol/L (ref 135–145)
Total Bilirubin: 0.9 mg/dL (ref 0.3–1.2)
Total Protein: 6.3 g/dL — ABNORMAL LOW (ref 6.5–8.1)

## 2018-11-27 LAB — CBC
HCT: 31 % — ABNORMAL LOW (ref 39.0–52.0)
HEMOGLOBIN: 10.3 g/dL — AB (ref 13.0–17.0)
MCH: 32.9 pg (ref 26.0–34.0)
MCHC: 33.2 g/dL (ref 30.0–36.0)
MCV: 99 fL (ref 80.0–100.0)
Platelets: 130 10*3/uL — ABNORMAL LOW (ref 150–400)
RBC: 3.13 MIL/uL — AB (ref 4.22–5.81)
RDW: 15.3 % (ref 11.5–15.5)
WBC: 6.5 10*3/uL (ref 4.0–10.5)
nRBC: 0 % (ref 0.0–0.2)

## 2018-11-27 LAB — PROCALCITONIN: PROCALCITONIN: 2.95 ng/mL

## 2018-11-27 MED ORDER — PANTOPRAZOLE SODIUM 40 MG PO TBEC
40.0000 mg | DELAYED_RELEASE_TABLET | Freq: Every day | ORAL | Status: DC
  Administered 2018-11-27 – 2018-11-29 (×3): 40 mg via ORAL
  Filled 2018-11-27 (×3): qty 1

## 2018-11-27 MED ORDER — CHLORDIAZEPOXIDE HCL 5 MG PO CAPS
15.0000 mg | ORAL_CAPSULE | Freq: Three times a day (TID) | ORAL | Status: DC
  Administered 2018-11-27 – 2018-11-29 (×7): 15 mg via ORAL
  Filled 2018-11-27 (×8): qty 3

## 2018-11-27 MED ORDER — METOPROLOL TARTRATE 50 MG PO TABS
50.0000 mg | ORAL_TABLET | Freq: Two times a day (BID) | ORAL | Status: DC
  Administered 2018-11-27 – 2018-11-29 (×5): 50 mg via ORAL
  Filled 2018-11-27 (×5): qty 1

## 2018-11-27 NOTE — Progress Notes (Signed)
Per RN patient had requested a nebulizer treatment . At this time patient sleeping and in no respiratory distress.

## 2018-11-27 NOTE — Progress Notes (Signed)
PHARMACIST - PHYSICIAN COMMUNICATION  DR:   Sherryll BurgerShah  CONCERNING: IV- to- Oral Route Change Policy  RECOMMENDATION: This patient is receiving famotidine by the intravenous route.  Based on criteria approved by the Pharmacy and Therapeutics Committee, the intravenous medication(s) is/are being converted to the equivalent oral dose form(s).   DESCRIPTION: These criteria include:  The patient is eating (either orally or via tube) and/or has been taking other orally administered medications for a least 24 hours  The patient has no evidence of active gastrointestinal bleeding or impaired GI absorption (gastrectomy, short bowel, patient on TNA or NPO).  If you have questions about this conversion, please contact the Pharmacy Department  [x]   [phone]( [phone] )  [facility] []   [phone]( [phone] )  [facility] []   [phone]( [phone] )  [facility] []   [phone]( [phone] )  [facility] []   [phone]( [phone] )  [facility]   [place] Chanique Duca, [facility] 11/27/2018 11:14 AM

## 2018-11-27 NOTE — Progress Notes (Signed)
PROGRESS NOTE    Matthew Moody  ZOX:[id number] DOB: [birth year] DOA: 11/24/2018 PCP: Patient, No Pcp Per   Brief Narrative:  Per HPI form Dr. Gonzella LexAlcus Dad a47 y.o.male,with history of ongoing alcohol abuse, alcoholic pancreatitis, hospitalized in January 2019 presented to the ED with 2 days history of progressive epigastric pain radiating to the sides, nausea with nonbilious, nonbloody vomiting and poor p.o. intake. Reports his last drink to be on the day of Thanksgiving (5 days ago). He normally drinks about one half to full pint of vodka daily. He reports having several episodes of vomiting and since yesterday also having some loose bowel movements. He denies any hematemesis, melena, bright red blood per rectum. Reports some headache but denies any blurred vision, confusion, hallucinations or tremors. Denies any other medications besides multivitamin. Denies chest pain, shortness of breath, palpitation, fevers or chills. No recent travel or sick contact.  Assessment & Plan:   Principal Problem:   Acute alcoholic pancreatitis Active Problems:   Pancreatitis, recurrent   Accelerated hypertension   Alcohol dependence (HCC)   Impaired glucose tolerance   Acute on chronic pancreatitis (HCC)  1. Acute, recurrent alcoholic pancreatitis.    No further nausea vomiting.  Still having some pain in his abdomen.  Patient willing to advance diet.  Advance to full liquid and follow response.  Continue as needed antiemetics, PRN analgesics and the use of PPI. 2. Delirium tremens.  Mentation has now improved and there is no significant signs of withdrawal.  Negative asterixis on exam.  Patient will be started on Librium will continue the use of PRN ativan as per CIWA protocol.  Thiamine and folic acid to be continued. 3. Lactic acidosis-improved/resolved.  Patient is afebrile and no having any acute source of infection appreciated.  Will discontinue antibiotics and will monitor  response. 4. Accelerated hypertension-improving.    Continue Norvasc and adjusted dose of metoprolol.  Continue PRN hydralazine.  Heart healthy diet discussed with patient. 5. Hypomagnesemia.  Associated with alcohol use.  Will follow electrolytes trend and further replete as needed. 6. Worsening thrombocytopenia.  Avoid heparin and follow platelets trend.  Continue SCDs for DVT prophylaxis. 7. Chronic alcohol abuse.  Maintain on CIWA protocol.  No active withdrawal symptoms currently noted.    Patient has been counseled on alcohol cessation and is started on Librium, scheduled. 8. Impaired glucose tolerance.  Hemoglobin A1c is 5.8%.  Follow-up outpatient.  Modified carbohydrate diet has been discussed with patient.  DVT prophylaxis:  SCDs Code Status: Full code Family Communication: None at bedside Disposition Plan: Continue diet advancement; decrease fluid resuscitation as he is now having any further nausea or vomiting and is drinking better.  Start the use of Librium and continue PRN Ativan with CIWA.  Hopefully home in the next day or so.   Consultants:   None  Procedures:   None  Antimicrobials:   Zosyn 12/4-12/4  Zosyn 12/4->12/6  Subjective: Patient overall improving.  Still having some midepigastric abdominal discomfort; denies nausea and vomiting.  He is afebrile.  Mentation wise stable and not demonstrating significant ongoing withdrawal from alcohol currently.  Objective: Vitals:   11/26/18 2037 11/27/18 0630 11/27/18 0832 11/27/18 1311  BP: (!) 149/89 (!) 170/103 (!) 164/106 (!) 153/101  Pulse:  (!) 118 (!) 121 (!) 109  Resp:  20  20  Temp:  98.1 F (36.7 C)  97.8 F (36.6 C)  TempSrc:  Oral  Oral  SpO2:  100% 98% 99%  Weight:  Height:        Intake/Output Summary (Last 24 hours) at 11/27/2018 1651 Last data filed at 11/27/2018 1234 Gross per 24 hour  Intake 4081.21 ml  Output 800 ml  Net 3281.21 ml   Filed Weights   11/24/18 1055  Weight: 93  kg    Examination: General exam: Alert, awake, oriented x 3; patient is calm and denying any nausea vomiting currently.  Still having mild abdominal discomfort but is feeling better.  No fever. Respiratory system: Positive rhonchi, no crackles, normal respiratory effort.   Cardiovascular system: Mild tachycardia, no rubs, no gallops, no JVD, no murmurs appreciated on exam. Gastrointestinal system: Abdomen is nondistended, soft and only mildly tender on palpation in his mid epigastric area. No organomegaly or masses felt. Normal bowel sounds heard. Central nervous system: Alert and oriented. No focal neurological deficits. Extremities: No cyanosis or clubbing. Skin: No rashes, lesions or ulcers Psychiatry: Judgement and insight appear normal. Mood & affect stable.  Data Reviewed: I have personally reviewed following labs and imaging studies  CBC: Recent Labs  Lab 11/24/18 1112 11/25/18 0251 11/26/18 0452 11/27/18 0528  WBC 12.8* 13.1* 8.8 6.5  NEUTROABS 11.7*  --   --   --   HGB 15.6 14.6 12.2* 10.3*  HCT 45.7 42.8 37.4* 31.0*  MCV 94.2 95.5 98.2 99.0  PLT 235 191 134* 130*   Basic Metabolic Panel: Recent Labs  Lab 11/24/18 1106 11/25/18 0251 11/26/18 0452 11/27/18 0528  NA 138 136 136 135  K 3.9 3.7 3.8 3.6  CL 98 104 105 103  CO2 24 19* 22 24  GLUCOSE 195* 168* 151* 136*  BUN 10 10 10 8   CREATININE 1.14 0.90 0.85 0.72  CALCIUM 9.2 7.8* 7.2* 7.6*  MG  --  1.2* 2.2  --   PHOS  --  2.2*  --   --    GFR: Estimated Creatinine Clearance: 129 mL/min (by C-G formula based on SCr of 0.72 mg/dL).   Liver Function Tests: Recent Labs  Lab 11/24/18 1106 11/25/18 0251 11/26/18 0452 11/27/18 0528  AST 53* 49* 30 40  ALT 51* 35 23 24  ALKPHOS 57 44 43 53  BILITOT 1.6* 1.1 0.8 0.9  PROT 8.2* 7.0 6.3* 6.3*  ALBUMIN 4.6 3.7 2.8* 2.8*   Recent Labs  Lab 11/24/18 1106 11/25/18 0251  LIPASE 1,086* 293*   Coagulation Profile: Recent Labs  Lab 11/25/18 0251  INR  1.18   Sepsis Labs: Recent Labs  Lab 11/25/18 0251 11/25/18 0958 11/25/18 1727 11/26/18 0452 11/27/18 0528  PROCALCITON  --   --  3.90 4.85 2.95  LATICACIDVEN 2.5* 2.5*  --  1.7  --     Recent Results (from the past 240 hour(s))  Culture, blood (x 2)     Status: None (Preliminary result)   Collection Time: 11/25/18  2:53 AM  Result Value Ref Range Status   Specimen Description BLOOD RIGHT ANTECUBITAL  Final   Special Requests   Final    BOTTLES DRAWN AEROBIC AND ANAEROBIC Blood Culture results may not be optimal due to an excessive volume of blood received in culture bottles   Culture   Final    NO GROWTH 2 DAYS Performed at [facility], [address], [place], [place] [address]    Report Status PENDING  Incomplete  Culture, blood (x 2)     Status: None (Preliminary result)   Collection Time: 11/25/18  3:01 AM  Result Value Ref Range Status  Specimen Description BLOOD LEFT FOREARM  Final   Special Requests   Final    BOTTLES DRAWN AEROBIC AND ANAEROBIC Blood Culture adequate volume   Culture   Final    NO GROWTH 2 DAYS Performed at [facility], [address] [place], [place] [address]    Report Status PENDING  Incomplete     Scheduled Meds: . amLODipine  5 mg Oral Daily  . chlordiazePOXIDE  15 mg Oral TID  . folic acid  1 mg Oral Daily  . Influenza vac split quadrivalent PF  0.5 mL Intramuscular Tomorrow-1000  . metoprolol tartrate  50 mg Oral BID  . multivitamin with minerals  1 tablet Oral Daily  . thiamine  100 mg Oral Daily   Continuous Infusions: . 0.9 % NaCl with KCl 20 mEq / L 150 mL/hr at 11/27/18 0834     LOS: 3 days    Time spent: 30 minutes    Matthew Lollarlos Chou Busler, MD [facility] Pager [phone]  If 7PM-7AM, please contact night-coverage [email or web address] Password [email or web address] 11/27/2018, 4:51 PM

## 2018-11-28 MED ORDER — MORPHINE SULFATE (PF) 2 MG/ML IV SOLN: 2.0000 mg | INTRAVENOUS | Status: DC | PRN

## 2018-11-28 MED ORDER — HYDROCODONE-ACETAMINOPHEN 10-325 MG PO TABS
1.0000 | ORAL_TABLET | Freq: Four times a day (QID) | ORAL | Status: DC | PRN
  Administered 2018-11-29: 1 via ORAL
  Filled 2018-11-28: qty 1

## 2018-11-28 NOTE — Progress Notes (Signed)
PROGRESS NOTE    Matthew Moody  WUJ:[id number] DOB: 12-07-1971 DOA: 11/24/2018 PCP: Patient, No Pcp Per   Brief Narrative:  Per HPI form Dr. Gonzella LexAlcus Dad a47 y.o.male,with history of ongoing alcohol abuse, alcoholic pancreatitis, hospitalized in January 2019 presented to the ED with 2 days history of progressive epigastric pain radiating to the sides, nausea with nonbilious, nonbloody vomiting and poor p.o. intake. Reports his last drink to be on the day of Thanksgiving (5 days ago). He normally drinks about one half to full pint of vodka daily. He reports having several episodes of vomiting and since yesterday also having some loose bowel movements. He denies any hematemesis, melena, bright red blood per rectum. Reports some headache but denies any blurred vision, confusion, hallucinations or tremors. Denies any other medications besides multivitamin. Denies chest pain, shortness of breath, palpitation, fevers or chills. No recent travel or sick contact.  Assessment & Plan:   Principal Problem:   Acute alcoholic pancreatitis Active Problems:   Pancreatitis, recurrent   Accelerated hypertension   Alcohol dependence (HCC)   Impaired glucose tolerance   Acute on chronic pancreatitis (HCC)  1. Acute, recurrent alcoholic pancreatitis.    No further nausea or vomiting.  Still having some pain in his abdomen; but not terrible.  Patient willing to advance diet further.  Advance to heart healthy diet and follow response.  Continue as needed antiemetics, PRN analgesics (now transition to oral agents) and the use of PPI. 2. Delirium tremens.  Mentation has now improved and there is no significant signs of withdrawal.  Negative asterixis on exam.  Patient will continue on Librium and continue the use of PRN ativan as per CIWA protocol.  Thiamine and folic acid to be continued. 3. Lactic acidosis-improved/resolved.  Patient is afebrile and no having any acute source of infection  appreciated.  Will discontinue antibiotics and will monitor response. 4. Accelerated hypertension-continue improving.    Continue Norvasc and adjusted dose of metoprolol.  Continue PRN hydralazine.  Heart healthy diet discussed with patient. 5. Hypomagnesemia.  Associated with alcohol use.  Will follow electrolytes trend and further replete as needed. 6. Worsening thrombocytopenia.  Avoid heparin and follow platelets trend.  Continue SCDs for DVT prophylaxis. 7. Chronic alcohol abuse.  Maintain on CIWA protocol.  No active withdrawal symptoms currently noted.    Patient has been counseled on alcohol cessation and will continue Librium. 8. Impaired glucose tolerance.  Hemoglobin A1c is 5.8%.  Follow-up outpatient.  Modified carbohydrate diet has been discussed with patient.  DVT prophylaxis:  SCDs Code Status: Full code Family Communication: None at bedside Disposition Plan: Continue diet advancement; decrease fluid resuscitation as he is now having any further nausea or vomiting and is drinking better.  Start the use of Librium and continue PRN Ativan with CIWA.  Hopefully home in am   Consultants:   None  Procedures:   None  Antimicrobials:   Zosyn 12/4-12/4  Zosyn 12/4->12/6  Subjective: Alert, awake and oriented x3; afebrile and denying any nausea or vomiting.  He tolerated full liquid diet and expressed just minimal abdominal discomfort intermittently.  Would like to have his diet advanced and switch to oral pain medication.  Objective: Vitals:   11/28/18 0611 11/28/18 0618 11/28/18 1026 11/28/18 1515  BP: (!) 162/100 (!) 163/100 (!) 165/99 (!) 164/111  Pulse: 97 98  97  Resp: 18 20  16   Temp: 97.7 F (36.5 C)   98.9 F (37.2 C)  TempSrc: Oral   Oral  SpO2: 100% 100%  99%  Weight:      Height:        Intake/Output Summary (Last 24 hours) at 11/28/2018 1616 Last data filed at 11/28/2018 0900 Gross per 24 hour  Intake 1500 ml  Output 1402 ml  Net 98 ml   Filed  Weights   11/24/18 1055  Weight: 93 kg    Examination: General exam: Alert, awake, oriented x 3; feeling better and even having some abdominal discomfort intermittently willing to have his diet advanced and pursued the use of oral pain medications. Respiratory system: Clear to auscultation. Respiratory effort normal. Cardiovascular system:RRR. No murmurs, rubs, gallops. Gastrointestinal system: Abdomen is nondistended, soft and just mildly tender on palpation. No organomegaly or masses felt. Normal bowel sounds heard. Central nervous system: Alert and oriented. No focal neurological deficits. Extremities: No C/C/E, +pedal pulses Skin: No rashes, lesions or ulcers Psychiatry: Judgement and insight appear normal. Mood & affect appropriate.    Data Reviewed: I have personally reviewed following labs and imaging studies  CBC: Recent Labs  Lab 11/24/18 1112 11/25/18 0251 11/26/18 0452 11/27/18 0528  WBC 12.8* 13.1* 8.8 6.5  NEUTROABS 11.7*  --   --   --   HGB 15.6 14.6 12.2* 10.3*  HCT 45.7 42.8 37.4* 31.0*  MCV 94.2 95.5 98.2 99.0  PLT 235 191 134* 130*   Basic Metabolic Panel: Recent Labs  Lab 11/24/18 1106 11/25/18 0251 11/26/18 0452 11/27/18 0528  NA 138 136 136 135  K 3.9 3.7 3.8 3.6  CL 98 104 105 103  CO2 24 19* 22 24  GLUCOSE 195* 168* 151* 136*  BUN 10 10 10 8   CREATININE 1.14 0.90 0.85 0.72  CALCIUM 9.2 7.8* 7.2* 7.6*  MG  --  1.2* 2.2  --   PHOS  --  2.2*  --   --    GFR: Estimated Creatinine Clearance: 129 mL/min (by C-G formula based on SCr of 0.72 mg/dL).   Liver Function Tests: Recent Labs  Lab 11/24/18 1106 11/25/18 0251 11/26/18 0452 11/27/18 0528  AST 53* 49* 30 40  ALT 51* 35 23 24  ALKPHOS 57 44 43 53  BILITOT 1.6* 1.1 0.8 0.9  PROT 8.2* 7.0 6.3* 6.3*  ALBUMIN 4.6 3.7 2.8* 2.8*   Recent Labs  Lab 11/24/18 1106 11/25/18 0251  LIPASE 1,086* 293*   Coagulation Profile: Recent Labs  Lab 11/25/18 0251  INR 1.18   Sepsis  Labs: Recent Labs  Lab 11/25/18 0251 11/25/18 0958 11/25/18 1727 11/26/18 0452 11/27/18 0528  PROCALCITON  --   --  3.90 4.85 2.95  LATICACIDVEN 2.5* 2.5*  --  1.7  --     Recent Results (from the past 240 hour(s))  Culture, blood (x 2)     Status: None (Preliminary result)   Collection Time: 11/25/18  2:53 AM  Result Value Ref Range Status   Specimen Description BLOOD RIGHT ANTECUBITAL  Final   Special Requests   Final    BOTTLES DRAWN AEROBIC AND ANAEROBIC Blood Culture results may not be optimal due to an excessive volume of blood received in culture bottles   Culture   Final    NO GROWTH 3 DAYS Performed at [facility], [address] [place], [place] [address]    Report Status PENDING  Incomplete  Culture, blood (x 2)     Status: None (Preliminary result)   Collection Time: 11/25/18  3:01 AM  Result Value Ref Range Status   Specimen Description  BLOOD LEFT FOREARM  Final   Special Requests   Final    BOTTLES DRAWN AEROBIC AND ANAEROBIC Blood Culture adequate volume   Culture   Final    NO GROWTH 3 DAYS Performed at [facility], [address] [place], [place] [address]    Report Status PENDING  Incomplete     Scheduled Meds: . amLODipine  5 mg Oral Daily  . chlordiazePOXIDE  15 mg Oral TID  . folic acid  1 mg Oral Daily  . Influenza vac split quadrivalent PF  0.5 mL Intramuscular Tomorrow-1000  . metoprolol tartrate  50 mg Oral BID  . multivitamin with minerals  1 tablet Oral Daily  . pantoprazole  40 mg Oral Daily  . thiamine  100 mg Oral Daily   Continuous Infusions:    LOS: 4 days    Time spent: 30 minutes    Vassie Lollarlos Eathon Valade, MD [facility] Pager [phone]  If 7PM-7AM, please contact night-coverage [email or web address] Password [email or web address] 11/28/2018, 4:16 PM

## 2018-11-29 DIAGNOSIS — E876 Hypokalemia: Secondary | ICD-10-CM

## 2018-11-29 DIAGNOSIS — E6609 Other obesity due to excess calories: Secondary | ICD-10-CM

## 2018-11-29 DIAGNOSIS — Z6831 Body mass index (BMI) 31.0-31.9, adult: Secondary | ICD-10-CM

## 2018-11-29 LAB — COMPREHENSIVE METABOLIC PANEL
ALT: 60 U/L — ABNORMAL HIGH (ref 0–44)
AST: 71 U/L — ABNORMAL HIGH (ref 15–41)
Albumin: 3 g/dL — ABNORMAL LOW (ref 3.5–5.0)
Alkaline Phosphatase: 93 U/L (ref 38–126)
Anion gap: 9 (ref 5–15)
BILIRUBIN TOTAL: 0.9 mg/dL (ref 0.3–1.2)
BUN: 8 mg/dL (ref 6–20)
CO2: 24 mmol/L (ref 22–32)
Calcium: 8.6 mg/dL — ABNORMAL LOW (ref 8.9–10.3)
Chloride: 103 mmol/L (ref 98–111)
Creatinine, Ser: 0.67 mg/dL (ref 0.61–1.24)
GFR calc Af Amer: >60 mL/min (ref >60)
GFR calc non Af Amer: >60 mL/min (ref >60)
Glucose, Bld: 147 mg/dL — ABNORMAL HIGH (ref 70–99)
Potassium: 3.1 mmol/L — ABNORMAL LOW (ref 3.5–5.1)
Sodium: 136 mmol/L (ref 135–145)
TOTAL PROTEIN: 6.9 g/dL (ref 6.5–8.1)

## 2018-11-29 MED ORDER — AMLODIPINE BESYLATE 5 MG PO TABS: 5.0000 mg | ORAL_TABLET | Freq: Every day | ORAL | 1 refills | Status: DC

## 2018-11-29 MED ORDER — CHLORDIAZEPOXIDE HCL 5 MG PO CAPS: ORAL_CAPSULE | ORAL | 0 refills | Status: DC

## 2018-11-29 MED ORDER — PANTOPRAZOLE SODIUM 40 MG PO TBEC: 40.0000 mg | DELAYED_RELEASE_TABLET | Freq: Every day | ORAL | 1 refills | Status: DC

## 2018-11-29 MED ORDER — METOPROLOL TARTRATE 50 MG PO TABS: 50.0000 mg | ORAL_TABLET | Freq: Two times a day (BID) | ORAL | 1 refills | Status: DC

## 2018-11-29 MED ORDER — ONDANSETRON 8 MG PO TBDP: 8.0000 mg | ORAL_TABLET | Freq: Three times a day (TID) | ORAL | 0 refills | Status: DC | PRN

## 2018-11-29 MED ORDER — HYDROCODONE-ACETAMINOPHEN 10-325 MG PO TABS: 1.0000 | ORAL_TABLET | Freq: Three times a day (TID) | ORAL | 0 refills | Status: AC | PRN

## 2018-11-29 NOTE — Discharge Summary (Signed)
Physician Discharge Summary  Cephas DarbyKevin A Dexter ZOX:[id number] DOB: 06-29-1971 DOA: 11/24/2018  PCP: Patient, No Pcp Per  Admit date: 11/24/2018 Discharge date: 11/29/2018  Time spent: 35 minutes  Recommendations for Outpatient Follow-up:  Repeat basic metabolic panel and magnesium level to follow electrolytes and renal function trend Reassess blood pressure and further adjust antihypertensive regimen as needed Continue to keep an eye on patient's CBG and A1c he is meeting category of impaired glucose (a1c 5.8)  Discharge Diagnoses:  Principal Problem:   Acute alcoholic pancreatitis Active Problems:   Pancreatitis, recurrent   Accelerated hypertension   Alcohol dependence (HCC)   Impaired glucose tolerance   Benign essential HTN   Acute on chronic pancreatitis (HCC)   Class 1 obesity due to excess calories without serious comorbidity with body mass index (BMI) of 31.0 to 31.9 in adult   Hypomagnesemia   Discharge Condition: Stable and improved.  Patient discharged home with instruction to follow-up with PCP in 10 days.  Diet recommendation: Heart healthy, low calorie and modified carbohydrates diet.  Filed Weights   11/24/18 1055  Weight: 93 kg    History of present illness:  Patient be written by Dr. Gonzella Lexhungel on 11/24/2018 47 y.o. male, with history of ongoing alcohol abuse, alcoholic pancreatitis, hospitalized in January 2019 presented to the ED with 2 days history of progressive epigastric pain radiating to the sides, nausea with nonbilious, nonbloody vomiting and poor p.o. intake.  Reports his last drink to be on the day of Thanksgiving (5 days ago).  He normally drinks about one half to full pint of vodka daily.  He reports having several episodes of vomiting and since yesterday also having some loose bowel movements. He denies any hematemesis, melena, bright red blood per rectum.  Reports some headache but denies any blurred vision, confusion, hallucinations or tremors.  Denies  any other medications besides multivitamin.  Denies chest pain, shortness of breath, palpitation, fevers or chills.  No recent travel or sick contact.  Course in the ED Patient was hypertensive with blood pressure 192/112 mmHg, had low-grade fever of 99.5 F and remaining vitals were stable.  Blood work showed WC of 12 point 8K, normal electrolytes.  Mild transaminitis (50s) with total bilirubin 1.6, anion gap of 16 and markedly elevated lipase of thousand 86.  Alcohol level was undetectable and urine drug screen was negative. Patient given 4 mg IV morphine, 1 L normal saline bolus. CT of the abdomen pelvis showing findings of acute pancreatitis with significant peripancreatic fluid and free fluid within the abdomen and pelvis.  Chest x-ray unremarkable. Hospitalist consulted for admission to medical floor.  Hospital Course:  1. Acute, recurrent alcoholic pancreatitis.   No further nausea or vomiting.  Still having some intermittent pain in his abdomen; but not terrible.  Patient has tolerated low-fat diet prior to discharge and is currently not having any nausea vomiting.  He will be discharged home with prescription for Vicodin and Zofran ODT.  Instructed to maintain adequate hydration and to follow-up with PCP in 10 days.   2. Delirium tremens.  Mentation has now improved and back to baseline; there is no significant signs of withdrawal at this moment. Negative asterixis on exam.  Patient will continue on Librium  tapering at discharge.  Continue multivitamin to supplement thiamine and folic acid.   3. Lactic acidosis-improved/resolved.  Patient is afebrile and no having any acute source of infection appreciated.    Patient with SIRS in the setting of acute alcoholic pancreatitis and  nonthreatening withdrawal. 4. Accelerated hypertension-continue improving and is stable for discharge.  Patient will go home on metoprolol and amlodipine.  Low-sodium diet and weight loss has been encouraged.  Reassess  blood pressure and further adjust antihypertensive regimen during follow-up visit. 5. Hypomagnesemia and hypokalemia.  Associated with alcohol use. Electrolytes were repleted.  Repeat basic metabolic panel and magnesium level at follow-up visit to reassess trend. 6. Worsening thrombocytopenia.    Most likely associated with chronic history of alcohol abuse.  Heparin products were held and will recommend repeat CBC to follow platelets trend as an outpatient. 7. Chronic alcohol abuse. No major active signs or symptoms of withdrawal.  Will discharge on Librium tapering.  Alcohol cessation has been provided. 8. Impaired glucose tolerance. Hemoglobin A1c is 5.8%.  Will need follow-up as an outpatient.  Modified carbohydrate diet has been discussed with patient. 9. Class I obesity: In the setting of excess calorie intake.  Low calorie diet, increase physical activity and portion control has been discussed with patient. Body mass index is 31.17 kg/m.   Procedures:  See below for x-ray reports   Consultations:  None   Discharge Exam: Vitals:   11/29/18 0607 11/29/18 0913  BP: (!) 166/113 (!) 162/98  Pulse: (!) 104 (!) 104  Resp: 20   Temp: 97.7 F (36.5 C)   SpO2: 100%    General exam: Alert, awake, oriented x 3; feeling much better and even having some abdominal discomfort intermittently he is ready to go home.  Respiratory system: Clear to auscultation. Respiratory effort normal. Cardiovascular system:RRR. No murmurs, rubs, gallops. Gastrointestinal system: Abdomen is nondistended, soft and nontender on exam. No organomegaly or masses felt. Normal bowel sounds heard. Central nervous system: Alert and oriented. No focal neurological deficits. Extremities: No C/C/E, +pedal pulses Skin: No rashes, lesions or ulcers Psychiatry: Judgement and insight appear normal. Mood & affect appropriate.    Discharge Instructions   Discharge Instructions    Diet - low sodium heart healthy    Complete by:  As directed    Discharge instructions   Complete by:  As directed    Take medications as prescribed Keep yourself well-hydrated Stop the use of alcohol Follow heart healthy and low-fat diet Arrange follow-up with PCP in 10 days.   Increase activity slowly   Complete by:  As directed      Allergies as of 11/29/2018   No Known Allergies     Medication List    STOP taking these medications   sodium-potassium bicarbonate Tbef dissolvable tablet Commonly known as:  ALKA-SELTZER GOLD     TAKE these medications   amLODipine 5 MG tablet Commonly known as:  NORVASC Take 1 tablet (5 mg total) by mouth daily. Start taking on:  11/30/2018   chlordiazePOXIDE 5 MG capsule Commonly known as:  LIBRIUM Take 2 tablets by mouth 3 times a day x1 day; then 2 tablets by mouth twice a day x2 days; then 1 tablet by mouth 3 times a day x2 days; then 1 tablet by mouth twice a day x2 days; then 1 tablet by mouth daily x2 days and stop medication.   HYDROcodone-acetaminophen 10-325 MG tablet Commonly known as:  NORCO Take 1 tablet by mouth every 8 (eight) hours as needed for up to 5 days for severe pain.   metoprolol tartrate 50 MG tablet Commonly known as:  LOPRESSOR Take 1 tablet (50 mg total) by mouth 2 (two) times daily.   multivitamin with minerals Tabs tablet Take 1 tablet  by mouth daily.   ondansetron 8 MG disintegrating tablet Commonly known as:  ZOFRAN-ODT Take 1 tablet (8 mg total) by mouth every 8 (eight) hours as needed for nausea or vomiting.   pantoprazole 40 MG tablet Commonly known as:  PROTONIX Take 1 tablet (40 mg total) by mouth daily. Start taking on:  11/30/2018      No Known Allergies Follow-up Information    [facility] Follow up.   Why:  appoinment Monday 11/30/18 at 3 pm- Please call if you need to reschedule. Contact information: [address]  [place] [address] [phone]          The results of significant diagnostics from this  hospitalization (including imaging, microbiology, ancillary and laboratory) are listed below for reference.    Significant Diagnostic Studies: Dg Chest 2 View  Result Date: 11/24/2018 CLINICAL DATA:  Abdominal pain.  Nausea, vomiting, diarrhea. EXAM: CHEST - 2 VIEW COMPARISON:  Chest x-ray 05/03/2018. FINDINGS: Mediastinum and hilar structures normal. Low lung volumes. No focal alveolar infiltrate. No pleural effusion or pneumothorax. No acute bony abnormality. IMPRESSION: No acute cardiopulmonary disease. Electronically Signed   By: Maisie Fus  Register   On: 11/24/2018 11:48   Ct Abdomen Pelvis W Contrast  Result Date: 11/24/2018 CLINICAL DATA:  Abdominal pain for 2 days EXAM: CT ABDOMEN AND PELVIS WITH CONTRAST TECHNIQUE: Multidetector CT imaging of the abdomen and pelvis was performed using the standard protocol following bolus administration of intravenous contrast. CONTRAST:  ISOVUE-300 IOPAMIDOL (ISOVUE-300) INJECTION 61% COMPARISON:  05/03/2018 FINDINGS: Lower chest: No acute abnormality. Hepatobiliary: Liver is diffusely fatty infiltrated. Minimal perihepatic fluid is noted. The gallbladder is unremarkable. Pancreas: The pancreas is well visualized and demonstrates a normal enhancement pattern. Considerable peripancreatic inflammatory change is noted consistent with acute pancreatitis. The overall appearance is increased when compared with the prior exam with more fluid along the anterior aspect of Gerota's fascia bilaterally and free fluid within the pelvis. Spleen: Normal in size without focal abnormality. Adrenals/Urinary Tract: Adrenal glands are within normal limits. Kidneys are well visualize without evidence of renal calculi or obstructive changes. The bladder is decompressed. Stomach/Bowel: The appendix is within normal limits. No obstructive or inflammatory changes in the bowel are seen. Vascular/Lymphatic: No significant vascular findings are present. No enlarged abdominal or pelvic  lymph nodes. Reproductive: Prostate is unremarkable. Other: Free fluid is noted within the abdomen and pelvis related to the pancreatitis. No hernia is identified. Musculoskeletal: Mild degenerative changes of lumbar spine are noted. IMPRESSION: Changes consistent with acute pancreatitis with significant peripancreatic fluid and free fluid within the abdomen and pelvis. Fatty infiltration of the liver. No other focal abnormality is noted. Electronically Signed   By: Alcide Clever M.D.   On: 11/24/2018 12:43   Microbiology: Recent Results (from the past 240 hour(s))  Culture, blood (x 2)     Status: None (Preliminary result)   Collection Time: 11/25/18  2:53 AM  Result Value Ref Range Status   Specimen Description BLOOD RIGHT ANTECUBITAL  Final   Special Requests   Final    BOTTLES DRAWN AEROBIC AND ANAEROBIC Blood Culture results may not be optimal due to an excessive volume of blood received in culture bottles   Culture   Final    NO GROWTH 4 DAYS Performed at [facility], [address] [place], [place] [address]    Report Status PENDING  Incomplete  Culture, blood (x 2)     Status: None (Preliminary result)   Collection Time: 11/25/18  3:01 AM  Result Value Ref Range Status   Specimen Description BLOOD LEFT FOREARM  Final   Special Requests   Final    BOTTLES DRAWN AEROBIC AND ANAEROBIC Blood Culture adequate volume   Culture   Final    NO GROWTH 4 DAYS Performed at [facility], [address], [place], [place] [address]    Report Status PENDING  Incomplete     Labs: Basic Metabolic Panel: Recent Labs  Lab 11/24/18 1106 11/25/18 0251 11/26/18 0452 11/27/18 0528 11/29/18 0626  NA 138 136 136 135 136  K 3.9 3.7 3.8 3.6 3.1*  CL 98 104 105 103 103  CO2 24 19* 22 24 24   GLUCOSE 195* 168* 151* 136* 147*  BUN 10 10 10 8 8   CREATININE 1.14 0.90 0.85 0.72 0.67  CALCIUM 9.2 7.8* 7.2* 7.6* 8.6*  MG  --  1.2* 2.2  --   --   PHOS  --  2.2*  --   --   --    Liver Function  Tests: Recent Labs  Lab 11/24/18 1106 11/25/18 0251 11/26/18 0452 11/27/18 0528 11/29/18 0626  AST 53* 49* 30 40 71*  ALT 51* 35 23 24 60*  ALKPHOS 57 44 43 53 93  BILITOT 1.6* 1.1 0.8 0.9 0.9  PROT 8.2* 7.0 6.3* 6.3* 6.9  ALBUMIN 4.6 3.7 2.8* 2.8* 3.0*   Recent Labs  Lab 11/24/18 1106 11/25/18 0251  LIPASE 1,086* 293*   CBC: Recent Labs  Lab 11/24/18 1112 11/25/18 0251 11/26/18 0452 11/27/18 0528  WBC 12.8* 13.1* 8.8 6.5  NEUTROABS 11.7*  --   --   --   HGB 15.6 14.6 12.2* 10.3*  HCT 45.7 42.8 37.4* 31.0*  MCV 94.2 95.5 98.2 99.0  PLT 235 191 134* 130*    Signed:  Vassie Loll MD.  [facility] 11/29/2018, 2:03 PM

## 2018-11-30 LAB — CULTURE, BLOOD (ROUTINE X 2)
Culture: NO GROWTH
Culture: NO GROWTH
Special Requests: ADEQUATE

## 2019-07-12 ENCOUNTER — Other Ambulatory Visit: Payer: Self-pay

## 2019-07-12 DIAGNOSIS — Z20822 Contact with and (suspected) exposure to covid-19: Secondary | ICD-10-CM

## 2019-07-15 LAB — NOVEL CORONAVIRUS, NAA: SARS-CoV-2, NAA: NOT DETECTED

## 2019-09-20 ENCOUNTER — Other Ambulatory Visit: Payer: Self-pay

## 2019-09-20 DIAGNOSIS — Z20822 Contact with and (suspected) exposure to covid-19: Secondary | ICD-10-CM

## 2019-09-21 LAB — NOVEL CORONAVIRUS, NAA: SARS-CoV-2, NAA: NOT DETECTED

## 2020-03-17 ENCOUNTER — Ambulatory Visit: Payer: Self-pay | Attending: Internal Medicine

## 2020-03-17 DIAGNOSIS — Z23 Encounter for immunization: Secondary | ICD-10-CM

## 2020-03-17 NOTE — Progress Notes (Signed)
   Covid-19 Vaccination Clinic  Name:  Matthew Moody    MRN: [id number] DOB: [birth year]  03/17/2020  Matthew Moody was observed post Covid-19 immunization for 15 minutes without incident. He was provided with Vaccine Information Sheet and instruction to access the V-Safe system.   Matthew Moody was instructed to call 911 with any severe reactions post vaccine: Marland Kitchen Difficulty breathing  . Swelling of face and throat  . A fast heartbeat  . A bad rash all over body  . Dizziness and weakness   Immunizations Administered    Name Date Dose VIS Date Route   Moderna COVID-19 Vaccine 03/17/2020  8:26 AM 0.5 mL 11/23/2019 Intramuscular   Manufacturer: Moderna   Lot: [id number]   NDC: 22025-427-06

## 2020-04-18 ENCOUNTER — Ambulatory Visit: Payer: Self-pay | Attending: Internal Medicine

## 2020-04-18 DIAGNOSIS — Z23 Encounter for immunization: Secondary | ICD-10-CM

## 2020-04-18 NOTE — Progress Notes (Signed)
   Covid-19 Vaccination Clinic  Name:  Matthew Moody    MRN: [id number] DOB: 1971-12-23  04/18/2020  [name] was observed post Covid-19 immunization for 15 minutes without incident. He was provided with Vaccine Information Sheet and instruction to access the V-Safe system.   [name] was instructed to call 911 with any severe reactions post vaccine: Marland Kitchen Difficulty breathing  . Swelling of face and throat  . A fast heartbeat  . A bad rash all over body  . Dizziness and weakness   Immunizations Administered    Name Date Dose VIS Date Route   Moderna COVID-19 Vaccine 04/18/2020 10:20 AM 0.5 mL 11/2019 Intramuscular   Manufacturer: Moderna   Lot: [id number]   NDC: 32023-343-56

## 2020-12-21 ENCOUNTER — Ambulatory Visit: Payer: Self-pay | Attending: Internal Medicine

## 2020-12-21 DIAGNOSIS — Z23 Encounter for immunization: Secondary | ICD-10-CM

## 2020-12-21 NOTE — Progress Notes (Signed)
   Covid-19 Vaccination Clinic  Name:  Matthew Moody    MRN: [id number] DOB: June 12, 1971  12/21/2020  Mr. Lwin was observed post Covid-19 immunization for 15 minutes without incident. He was provided with Vaccine Information Sheet and instruction to access the V-Safe system.   Mr. Nickson was instructed to call 911 with any severe reactions post vaccine: Marland Kitchen Difficulty breathing  . Swelling of face and throat  . A fast heartbeat  . A bad rash all over body  . Dizziness and weakness   Immunizations Administered    Name Date Dose VIS Date Route   Moderna Covid-19 Booster Vaccine 12/21/2020  1:40 PM 0.25 mL 10/11/2020 Intramuscular   Manufacturer: Moderna   Lot: [id number]   NDC: 11031-594-58

## 2021-10-30 ENCOUNTER — Ambulatory Visit: Payer: Self-pay | Admitting: Nurse Practitioner

## 2021-11-27 ENCOUNTER — Ambulatory Visit: Payer: Self-pay | Admitting: Nurse Practitioner

## 2021-12-26 ENCOUNTER — Ambulatory Visit: Payer: Self-pay | Admitting: Nurse Practitioner

## 2022-01-29 ENCOUNTER — Ambulatory Visit: Payer: Self-pay | Admitting: Nurse Practitioner

## 2023-01-29 ENCOUNTER — Ambulatory Visit: Payer: Medicaid Other | Admitting: Family Medicine

## 2023-02-17 ENCOUNTER — Ambulatory Visit: Payer: Medicaid Other | Admitting: Family Medicine

## 2023-02-17 ENCOUNTER — Encounter: Payer: Self-pay | Admitting: Family Medicine

## 2023-02-17 VITALS — BP 136/86 | HR 101 | Ht 68.0 in | Wt 193.8 lb

## 2023-02-17 DIAGNOSIS — I10 Essential (primary) hypertension: Secondary | ICD-10-CM | POA: Diagnosis not present

## 2023-02-17 DIAGNOSIS — Z125 Encounter for screening for malignant neoplasm of prostate: Secondary | ICD-10-CM | POA: Diagnosis not present

## 2023-02-17 DIAGNOSIS — Z13220 Encounter for screening for lipoid disorders: Secondary | ICD-10-CM

## 2023-02-17 MED ORDER — AMLODIPINE BESYLATE 5 MG PO TABS: 5.0000 mg | ORAL_TABLET | Freq: Every day | ORAL | 1 refills | Status: DC

## 2023-02-17 MED ORDER — PANTOPRAZOLE SODIUM 40 MG PO TBEC: 40.0000 mg | DELAYED_RELEASE_TABLET | Freq: Every day | ORAL | 1 refills | Status: DC

## 2023-02-17 NOTE — Addendum Note (Signed)
Addended by: Sallee Lange A on: 02/17/2023 09:02 PM   Modules accepted: Orders

## 2023-02-17 NOTE — Progress Notes (Addendum)
   Subjective:    Patient ID: Matthew Moody, male    DOB: 04-Jun-1971, 52 y.o.   MRN: [id number]  HPI Patient arrives today to establish care with Dr Nicki Reaper.   Patient would like to discuss his blood pressure. Very nice new patient Needs refills on blood pressure medicine Tries to eat relatively healthy but admits to too much salt use Also admits to alcohol use sometimes beer sometimes liquor denies high fever chills sweats Family history of diabetes and hypertension no family history of cancer Patient does not smoke Stays active Likes to do base guitar playing with church   Review of Systems     Objective:   Physical Exam  General-in no acute distress Eyes-no discharge Lungs-respiratory rate normal, CTA CV-no murmurs,RRR Extremities skin warm dry no edema Neuro grossly normal Behavior normal, alert       Assessment & Plan:  Patient feels he can cut back on the alcohol. We will follow him up again within 3 months Patient to take his blood pressure medicine regular basis Minimize salt in diet Start fitting and walking Lab work ordered Colonoscopy this October Symptoms-refill of medicine sent in he may take daily but also told him that more than likely he would do fine utilizing the medicine 3 days a week

## 2023-02-17 NOTE — Patient Instructions (Addendum)
DASH Eating Plan DASH stands for Dietary Approaches to Stop Hypertension. The DASH eating plan is a healthy eating plan that has been shown to: Reduce high blood pressure (hypertension). Reduce your risk for type 2 diabetes, heart disease, and stroke. Help with weight loss. What are tips for following this plan? Reading food labels Check food labels for the amount of salt (sodium) per serving. Choose foods with less than 5 percent of the Daily Value of sodium. Generally, foods with less than 300 milligrams (mg) of sodium per serving fit into this eating plan. To find whole grains, look for the word "whole" as the first word in the ingredient list. Shopping Buy products labeled as "low-sodium" or "no salt added." Buy fresh foods. Avoid canned foods and pre-made or frozen meals. Cooking Avoid adding salt when cooking. Use salt-free seasonings or herbs instead of table salt or sea salt. Check with your health care provider or pharmacist before using salt substitutes. Do not fry foods. Cook foods using healthy methods such as baking, boiling, grilling, roasting, and broiling instead. Cook with heart-healthy oils, such as olive, canola, avocado, soybean, or sunflower oil. Meal planning  Eat a balanced diet that includes: 4 or more servings of fruits and 4 or more servings of vegetables each day. Try to fill one-half of your plate with fruits and vegetables. 6-8 servings of whole grains each day. Less than 6 oz (170 g) of lean meat, poultry, or fish each day. A 3-oz (85-g) serving of meat is about the same size as a deck of cards. One egg equals 1 oz (28 g). 2-3 servings of low-fat dairy each day. One serving is 1 cup (237 mL). 1 serving of nuts, seeds, or beans 5 times each week. 2-3 servings of heart-healthy fats. Healthy fats called omega-3 fatty acids are found in foods such as walnuts, flaxseeds, fortified milks, and eggs. These fats are also found in cold-water fish, such as sardines, salmon,  and mackerel. Limit how much you eat of: Canned or prepackaged foods. Food that is high in trans fat, such as some fried foods. Food that is high in saturated fat, such as fatty meat. Desserts and other sweets, sugary drinks, and other foods with added sugar. Full-fat dairy products. Do not salt foods before eating. Do not eat more than 4 egg yolks a week. Try to eat at least 2 vegetarian meals a week. Eat more home-cooked food and less restaurant, buffet, and fast food. Lifestyle When eating at a restaurant, ask that your food be prepared with less salt or no salt, if possible. If you drink alcohol: Limit how much you use to: 0-1 drink a day for women who are not pregnant. 0-2 drinks a day for men. Be aware of how much alcohol is in your drink. In the [place] one drink equals one 12 oz bottle of beer (355 mL), one 5 oz glass of wine (148 mL), or one 1 oz glass of hard liquor (44 mL). General information Avoid eating more than 2,300 mg of salt a day. If you have hypertension, you may need to reduce your sodium intake to 1,500 mg a day. Work with your health care provider to maintain a healthy body weight or to lose weight. Ask what an ideal weight is for you. Get at least 30 minutes of exercise that causes your heart to beat faster (aerobic exercise) most days of the week. Activities may include walking, swimming, or biking. Work with your health care provider or dietitian to   adjust your eating plan to your individual calorie needs. What foods should I eat? Fruits All fresh, dried, or frozen fruit. Canned fruit in natural juice (without added sugar). Vegetables Fresh or frozen vegetables (raw, steamed, roasted, or grilled). Low-sodium or reduced-sodium tomato and vegetable juice. Low-sodium or reduced-sodium tomato sauce and tomato paste. Low-sodium or reduced-sodium canned vegetables. Grains Whole-grain or whole-wheat bread. Whole-grain or whole-wheat pasta. Brown rice. Oatmeal. Quinoa.  Bulgur. Whole-grain and low-sodium cereals. Pita bread. Low-fat, low-sodium crackers. Whole-wheat flour tortillas. Meats and other proteins Skinless chicken or turkey. Ground chicken or turkey. Pork with fat trimmed off. Fish and seafood. Egg whites. Dried beans, peas, or lentils. Unsalted nuts, nut butters, and seeds. Unsalted canned beans. Lean cuts of beef with fat trimmed off. Low-sodium, lean precooked or cured meat, such as sausages or meat loaves. Dairy Low-fat (1%) or fat-free (skim) milk. Reduced-fat, low-fat, or fat-free cheeses. Nonfat, low-sodium ricotta or cottage cheese. Low-fat or nonfat yogurt. Low-fat, low-sodium cheese. Fats and oils Soft margarine without trans fats. Vegetable oil. Reduced-fat, low-fat, or light mayonnaise and salad dressings (reduced-sodium). Canola, safflower, olive, avocado, soybean, and sunflower oils. Avocado. Seasonings and condiments Herbs. Spices. Seasoning mixes without salt. Other foods Unsalted popcorn and pretzels. Fat-free sweets. The items listed above may not be a complete list of foods and beverages you can eat. Contact a dietitian for more information. What foods should I avoid? Fruits Canned fruit in a light or heavy syrup. Fried fruit. Fruit in cream or butter sauce. Vegetables Creamed or fried vegetables. Vegetables in a cheese sauce. Regular canned vegetables (not low-sodium or reduced-sodium). Regular canned tomato sauce and paste (not low-sodium or reduced-sodium). Regular tomato and vegetable juice (not low-sodium or reduced-sodium). Pickles. Olives. Grains Baked goods made with fat, such as croissants, muffins, or some breads. Dry pasta or rice meal packs. Meats and other proteins Fatty cuts of meat. Ribs. Fried meat. Bacon. Bologna, salami, and other precooked or cured meats, such as sausages or meat loaves. Fat from the back of a pig (fatback). Bratwurst. Salted nuts and seeds. Canned beans with added salt. Canned or smoked fish.  Whole eggs or egg yolks. Chicken or turkey with skin. Dairy Whole or 2% milk, cream, and half-and-half. Whole or full-fat cream cheese. Whole-fat or sweetened yogurt. Full-fat cheese. Nondairy creamers. Whipped toppings. Processed cheese and cheese spreads. Fats and oils Butter. Stick margarine. Lard. Shortening. Ghee. Bacon fat. Tropical oils, such as coconut, palm kernel, or palm oil. Seasonings and condiments Onion salt, garlic salt, seasoned salt, table salt, and sea salt. Worcestershire sauce. Tartar sauce. Barbecue sauce. Teriyaki sauce. Soy sauce, including reduced-sodium. Steak sauce. Canned and packaged gravies. Fish sauce. Oyster sauce. Cocktail sauce. Store-bought horseradish. Ketchup. Mustard. Meat flavorings and tenderizers. Bouillon cubes. Hot sauces. Pre-made or packaged marinades. Pre-made or packaged taco seasonings. Relishes. Regular salad dressings. Other foods Salted popcorn and pretzels. The items listed above may not be a complete list of foods and beverages you should avoid. Contact a dietitian for more information. Where to find more information National Heart, Lung, and Blood Institute: www.nhlbi.nih.gov American Heart Association: www.heart.org Academy of Nutrition and Dietetics: www.eatright.org National Kidney Foundation: www.kidney.org Summary The DASH eating plan is a healthy eating plan that has been shown to reduce high blood pressure (hypertension). It may also reduce your risk for type 2 diabetes, heart disease, and stroke. When on the DASH eating plan, aim to eat more fresh fruits and vegetables, whole grains, lean proteins, low-fat dairy, and heart-healthy fats. With the DASH   eating plan, you should limit salt (sodium) intake to 2,300 mg a day. If you have hypertension, you may need to reduce your sodium intake to 1,500 mg a day. Work with your health care provider or dietitian to adjust your eating plan to your individual calorie needs. This information is not  intended to replace advice given to you by your health care provider. Make sure you discuss any questions you have with your health care provider. Document Revised: 11/12/2019 Document Reviewed: 11/12/2019 Elsevier Patient Education  [place] for Gastroesophageal Reflux Disease, Adult When you have gastroesophageal reflux disease (GERD), the foods you eat and your eating habits are very important. Choosing the right foods can help ease the discomfort of GERD. Consider working with a dietitian to help you make healthy food choices. What are tips for following this plan? Reading food labels Look for foods that are low in saturated fat. Foods that have less than 5% of daily value (DV) of fat and 0 g of trans fats may help with your symptoms. Cooking Cook foods using methods other than frying. This may include baking, steaming, grilling, or broiling. These are all methods that do not need a lot of fat for cooking. To add flavor, try to use herbs that are low in spice and acidity. Meal planning  Choose healthy foods that are low in fat, such as fruits, vegetables, whole grains, low-fat dairy products, lean meats, fish, and poultry. Eat frequent, small meals instead of three large meals each day. Eat your meals slowly, in a relaxed setting. Avoid bending over or lying down until 2-3 hours after eating. Limit high-fat foods such as fatty meats or fried foods. Limit your intake of fatty foods, such as oils, butter, and shortening. Avoid the following as told by your health care provider: Foods that cause symptoms. These may be different for different people. Keep a food diary to keep track of foods that cause symptoms. Alcohol. Drinking large amounts of liquid with meals. Eating meals during the 2-3 hours before bed. Lifestyle Maintain a healthy weight. Ask your health care provider what weight is healthy for you. If you need to lose weight, work with your health care provider  to do so safely. Exercise for at least 30 minutes on 5 or more days each week, or as told by your health care provider. Avoid wearing clothes that fit tightly around your waist and chest. Do not use any products that contain nicotine or tobacco. These products include cigarettes, chewing tobacco, and vaping devices, such as e-cigarettes. If you need help quitting, ask your health care provider. Sleep with the head of your bed raised. Use a wedge under the mattress or blocks under the bed frame to raise the head of the bed. Chew sugar-free gum after mealtimes. What foods should I eat?  Eat a healthy, well-balanced diet of fruits, vegetables, whole grains, low-fat dairy products, lean meats, fish, and poultry. Each person is different. Foods that may trigger symptoms in one person may not trigger any symptoms in another person. Work with your health care provider to identify foods that are safe for you. The items listed above may not be a complete list of recommended foods and beverages. Contact a dietitian for more information. What foods should I avoid? Limiting some of these foods may help manage the symptoms of GERD. Everyone is different. Consult a dietitian or your health care provider to help you identify the exact foods to avoid, if any. Fruits Any  fruits prepared with added fat. Any fruits that cause symptoms. For some people this may include citrus fruits, such as oranges, grapefruit, pineapple, and lemons. Vegetables Deep-fried vegetables. [place] fries. Any vegetables prepared with added fat. Any vegetables that cause symptoms. For some people, this may include tomatoes and tomato products, chili peppers, onions and garlic, and horseradish. Grains Pastries or quick breads with added fat. Meats and other proteins High-fat meats, such as fatty beef or pork, hot dogs, ribs, ham, sausage, salami, and bacon. Fried meat or protein, including fried fish and fried chicken. Nuts and nut butters,  in large amounts. Dairy Whole milk and chocolate milk. Sour cream. Cream. Ice cream. Cream cheese. Milkshakes. Fats and oils Butter. Margarine. Shortening. Ghee. Beverages Coffee and tea, with or without caffeine. Carbonated beverages. Sodas. Energy drinks. Fruit juice made with acidic fruits, such as orange or grapefruit. Tomato juice. Alcoholic drinks. Sweets and desserts Chocolate and cocoa. Donuts. Seasonings and condiments Pepper. Peppermint and spearmint. Added salt. Any condiments, herbs, or seasonings that cause symptoms. For some people, this may include curry, hot sauce, or vinegar-based salad dressings. The items listed above may not be a complete list of foods and beverages to avoid. Contact a dietitian for more information. Questions to ask your health care provider Diet and lifestyle changes are usually the first steps that are taken to manage symptoms of GERD. If diet and lifestyle changes do not improve your symptoms, talk with your health care provider about taking medicines. Where to find more information International Foundation for Gastrointestinal Disorders: aboutgerd.org Summary When you have gastroesophageal reflux disease (GERD), food and lifestyle choices may be very helpful in easing the discomfort of GERD. Eat frequent, small meals instead of three large meals each day. Eat your meals slowly, in a relaxed setting. Avoid bending over or lying down until 2-3 hours after eating. Limit high-fat foods such as fatty meats or fried foods. This information is not intended to replace advice given to you by your health care provider. Make sure you discuss any questions you have with your health care provider. Document Revised: 06/19/2020 Document Reviewed: 06/19/2020 Elsevier Patient Education  [place].

## 2023-06-18 ENCOUNTER — Ambulatory Visit: Payer: Medicaid Other | Admitting: Family Medicine

## 2023-07-11 ENCOUNTER — Other Ambulatory Visit: Payer: Self-pay | Admitting: Family Medicine

## 2023-07-12 LAB — CMP14+EGFR
ALT: 16 IU/L (ref 0–44)
AST: 12 IU/L (ref 0–40)
Albumin: 4.9 g/dL (ref 4.1–5.1)
Alkaline Phosphatase: 110 IU/L (ref 44–121)
BUN/Creatinine Ratio: 13 (ref 9–20)
BUN: 14 mg/dL (ref 6–24)
Bilirubin Total: 0.3 mg/dL (ref 0.0–1.2)
CO2: 25 mmol/L (ref 20–29)
Calcium: 10.1 mg/dL (ref 8.7–10.2)
Chloride: 93 mmol/L — ABNORMAL LOW (ref 96–106)
Creatinine, Ser: 1.11 mg/dL (ref 0.76–1.27)
Globulin, Total: 2.5 g/dL (ref 1.5–4.5)
Glucose: 435 mg/dL — ABNORMAL HIGH (ref 70–99)
Potassium: 4.5 mmol/L (ref 3.5–5.2)
Sodium: 134 mmol/L (ref 134–144)
Total Protein: 7.4 g/dL (ref 6.0–8.5)
eGFR: 81 mL/min/{1.73_m2} (ref >59)

## 2023-07-12 LAB — LIPID PANEL
Chol/HDL Ratio: 4.9 ratio (ref 0.0–5.0)
Cholesterol, Total: 236 mg/dL — ABNORMAL HIGH (ref 100–199)
HDL: 48 mg/dL (ref >39)
LDL Chol Calc (NIH): 141 mg/dL — ABNORMAL HIGH (ref 0–99)
Triglycerides: 258 mg/dL — ABNORMAL HIGH (ref 0–149)
VLDL Cholesterol Cal: 47 mg/dL — ABNORMAL HIGH (ref 5–40)

## 2023-07-12 LAB — MICROALBUMIN / CREATININE URINE RATIO
Creatinine, Urine: 53.4 mg/dL
Microalb/Creat Ratio: 12 mg/g creat (ref 0–29)
Microalbumin, Urine: 6.6 ug/mL

## 2023-07-12 LAB — SPECIMEN STATUS REPORT

## 2023-07-12 LAB — PSA: Prostate Specific Ag, Serum: 1.7 ng/mL (ref 0.0–4.0)

## 2023-07-14 ENCOUNTER — Other Ambulatory Visit: Payer: Self-pay

## 2023-07-14 DIAGNOSIS — R739 Hyperglycemia, unspecified: Secondary | ICD-10-CM

## 2023-07-16 ENCOUNTER — Ambulatory Visit: Payer: Medicaid Other | Admitting: Family Medicine

## 2023-07-16 VITALS — BP 124/78 | HR 103 | Wt 188.0 lb

## 2023-07-16 DIAGNOSIS — Z0001 Encounter for general adult medical examination with abnormal findings: Secondary | ICD-10-CM

## 2023-07-16 DIAGNOSIS — E1169 Type 2 diabetes mellitus with other specified complication: Secondary | ICD-10-CM | POA: Diagnosis not present

## 2023-07-16 DIAGNOSIS — E119 Type 2 diabetes mellitus without complications: Secondary | ICD-10-CM

## 2023-07-16 DIAGNOSIS — Z1211 Encounter for screening for malignant neoplasm of colon: Secondary | ICD-10-CM

## 2023-07-16 DIAGNOSIS — E785 Hyperlipidemia, unspecified: Secondary | ICD-10-CM | POA: Diagnosis not present

## 2023-07-16 DIAGNOSIS — Z0000 Encounter for general adult medical examination without abnormal findings: Secondary | ICD-10-CM

## 2023-07-16 LAB — HEMOGLOBIN A1C
Est. average glucose Bld gHb Est-mCnc: 318 mg/dL
Hgb A1c MFr Bld: 12.7 % — ABNORMAL HIGH (ref 4.8–5.6)

## 2023-07-16 MED ORDER — ROSUVASTATIN CALCIUM 10 MG PO TABS: 10.0000 mg | ORAL_TABLET | Freq: Every day | ORAL | 1 refills | Status: DC

## 2023-07-16 MED ORDER — METFORMIN HCL 500 MG PO TABS: 500.0000 mg | ORAL_TABLET | Freq: Two times a day (BID) | ORAL | 1 refills | Status: DC

## 2023-07-16 NOTE — Progress Notes (Signed)
   Subjective:    Patient ID: Matthew Moody, male    DOB: [birth year], 52 y.o.   MRN: [id number]  HPI Patient arrives today for 3 month follow up. Patient states no concerns or issues. The patient comes in today for a wellness visit.    A review of their health history was completed.  A review of medications was also completed.  Any needed refills; please update  Eating habits: Tries eat healthy but admits he takes in way too many sugars and starches  Falls/  MVA accidents in past few months: No injuries lately  Regular exercise: Stays busy with her transportation does do some walking  Specialist pt sees on regular basis: None  Preventative health issues were discussed.   Additional concerns: None Patient states he is trying to stay away from alcohol   Review of Systems     Objective:   Physical Exam General-in no acute distress Eyes-no discharge Lungs-respiratory rate normal, CTA CV-no murmurs,RRR Extremities skin warm dry no edema Neuro grossly normal Behavior normal, alert  This Results for orders placed or performed in visit on 07/14/23  Hemoglobin A1c  Result Value Ref Range   Hgb A1c MFr Bld 12.7 (H) 4.8 - 5.6 %   Est. average glucose Bld gHb Est-mCnc 318 mg/dL   Labs reviewed in detail      Assessment & Plan:   Adult wellness-complete.wellness physical was conducted today. Importance of diet and exercise were discussed in detail.  Importance of stress reduction and healthy living were discussed.  In addition to this a discussion regarding safety was also covered.  We also reviewed over immunizations and gave recommendations regarding current immunization needed for age.   In addition to this additional areas were also touched on including: Preventative health exams needed:  Colonoscopy referral for colonoscopy  Patient was advised yearly wellness exam  1. Well adult exam See above - Hemoglobin A1c - Basic Metabolic Panel - Lipid Panel -  ALT  2. Hyperlipidemia associated with type 2 diabetes mellitus (HCC) Start low-dose statin but wait 2 weeks - Lipid Panel  3. Diabetes mellitus without complication (HCC) Start metformin gradually as written and discussed side effects discussed check labs and 12 to 14 weeks with follow-up office visit - Hemoglobin A1c - Ambulatory referral to diabetic education  4. Screening for colon cancer Referral for colon cancer screening - Ambulatory referral to Gastroenterology Follow-up in 8 to 12 weeks

## 2023-07-16 NOTE — Patient Instructions (Addendum)
Hi Matthew Moody  It was good to see you today We will work hard at trying to set you up with the diabetic educator.  They will call you to set up the appointment We will also be working on setting you up for colonoscopy by this fall  Also-metformin start off with half tablet each evening for 5 days after supper. Then go to a half a tablet twice daily for 5 days Then go to a full tablet twice daily  Most people tolerate metformin well but if it causes a lot of intestinal upset or diarrhea please let us know we can try a extended release metformin  Cholesterol medicine I would recommend taking once each evening after supper most people tolerate it well but if it causes a lot of severe muscle aches and discomfort stop the medicine and let us know.  I would recommend using metformin for the next 2 weeks before adding the cholesterol medicine.  Please do blood work in approximately 3 months toward the end of October and we will see you back in early November.  Please take care-Dr. Lorin Picket  If you are having any health issues or problems please notify us

## 2023-07-17 ENCOUNTER — Encounter: Payer: Self-pay | Admitting: *Deleted

## 2023-07-31 ENCOUNTER — Other Ambulatory Visit: Payer: Self-pay | Admitting: *Deleted

## 2023-07-31 DIAGNOSIS — Z1211 Encounter for screening for malignant neoplasm of colon: Secondary | ICD-10-CM

## 2023-08-05 ENCOUNTER — Other Ambulatory Visit: Payer: Self-pay | Admitting: Family Medicine

## 2023-08-25 ENCOUNTER — Other Ambulatory Visit: Payer: Self-pay | Admitting: Family Medicine

## 2023-09-10 ENCOUNTER — Ambulatory Visit: Payer: Medicaid Other | Admitting: Nutrition

## 2023-10-14 ENCOUNTER — Ambulatory Visit: Payer: Medicaid Other | Admitting: Nutrition

## 2023-10-23 ENCOUNTER — Ambulatory Visit: Payer: Medicaid Other | Admitting: Family Medicine

## 2023-11-17 ENCOUNTER — Other Ambulatory Visit: Payer: Self-pay | Admitting: Family Medicine

## 2024-01-22 ENCOUNTER — Encounter (INDEPENDENT_AMBULATORY_CARE_PROVIDER_SITE_OTHER): Payer: Self-pay | Admitting: *Deleted

## 2024-02-19 ENCOUNTER — Other Ambulatory Visit: Payer: Self-pay | Admitting: Family Medicine

## 2024-03-01 ENCOUNTER — Ambulatory Visit: Payer: Medicaid Other | Admitting: Family Medicine

## 2024-03-19 ENCOUNTER — Encounter: Payer: Self-pay | Admitting: Nurse Practitioner

## 2024-03-19 ENCOUNTER — Ambulatory Visit: Admitting: Nurse Practitioner

## 2024-03-19 VITALS — BP 144/85 | HR 98 | Temp 97.9°F | Ht 68.0 in | Wt 189.0 lb

## 2024-03-19 DIAGNOSIS — E1165 Type 2 diabetes mellitus with hyperglycemia: Secondary | ICD-10-CM | POA: Diagnosis not present

## 2024-03-19 DIAGNOSIS — E1169 Type 2 diabetes mellitus with other specified complication: Secondary | ICD-10-CM

## 2024-03-19 DIAGNOSIS — E119 Type 2 diabetes mellitus without complications: Secondary | ICD-10-CM

## 2024-03-19 DIAGNOSIS — I10 Essential (primary) hypertension: Secondary | ICD-10-CM | POA: Diagnosis not present

## 2024-03-19 DIAGNOSIS — K859 Acute pancreatitis without necrosis or infection, unspecified: Secondary | ICD-10-CM | POA: Diagnosis not present

## 2024-03-19 DIAGNOSIS — K861 Other chronic pancreatitis: Secondary | ICD-10-CM

## 2024-03-19 LAB — POCT GLUCOSE FINGERSTICK: Glucose: 328 — AB (ref 70–99)

## 2024-03-19 NOTE — Progress Notes (Addendum)
 Subjective:    Patient ID: Matthew Moody, male    DOB: [birth year], 53 y.o.   MRN: [id number]  HPI Presents today after an episode at work on 03/17/2024. Describes the episode as he started feeling hot and flushed, had blurred vision, and felt like he was going to pass out. No syncope. Sat down and felt better after rest. Has been fatigued ever since. No fasting before this episode. Has been taking his Metformin as prescribed before, but stopped after he had the episode. Was afraid that the medication was dropping his blood sugar so he stopped taking it. Does not monitor blood sugars at home. He does eat a lot of carbs in his diet such as sandwiches and potato chips. Drinks sodas and sugary beverages. History of pancreatitis and alcohol use disorder. Reports that he drinks alcohol occasionally about once a month. Says that he drinks one shot of liquor and one beer when he does drink. Has not been to see a nutritionist or diabetic education class. Denies any headaches. Takes blood pressure at home and says that it ranges in the 140's systolic. Takes amlodipine as prescribed. Does have increased salt intake.   Review of Systems  Constitutional:  Positive for fatigue. Negative for activity change, appetite change and fever.  Eyes:  Negative for visual disturbance.  Respiratory:  Negative for cough, chest tightness, shortness of breath and wheezing.   Cardiovascular:  Negative for chest pain and leg swelling.  Gastrointestinal:  Negative for constipation, diarrhea, nausea and vomiting.  Endocrine: Positive for polydipsia, polyphagia and polyuria.  Neurological:  Positive for light-headedness. Negative for facial asymmetry, speech difficulty and weakness.      Objective:   Physical Exam Vitals and nursing note reviewed.  Constitutional:      General: He is not in acute distress.    Appearance: Normal appearance. He is not ill-appearing.  Cardiovascular:     Rate and Rhythm: Normal rate and  regular rhythm.     Heart sounds: Normal heart sounds, S1 normal and S2 normal. No murmur heard. Pulmonary:     Effort: Pulmonary effort is normal. No respiratory distress.     Breath sounds: Normal breath sounds. No wheezing.  Neurological:     Mental Status: He is alert.  Psychiatric:        Mood and Affect: Mood normal.        Behavior: Behavior normal.        Thought Content: Thought content normal.        Judgment: Judgment normal.    Today's Vitals   03/19/24 1525  BP: (!) 144/85  Pulse: 98  Temp: 97.9 F (36.6 C)  SpO2: 99%  Weight: 189 lb (85.7 kg)  Height: 5\' 8"  (1.727 m)   Body mass index is 28.74 kg/m.   Recent Results (from the past 2160 hours)  POCT Glucose Fingerstick     Status: Abnormal   Collection Time: 03/19/24  3:32 PM  Result Value Ref Range   Glucose 328 (A) 70 - 99       Assessment & Plan:  1. Type 2 diabetes mellitus without complication, without long-term current use of insulin (HCC) (Primary)  - POCT Glucose Fingerstick - Comprehensive metabolic panel with GFR - Hemoglobin A1c - Lipid panel - Microalbumin / creatinine urine ratio - C-peptide -Advised patient to restart Metformin today. Applied Dexcom on patient in office, and downloaded app. Educated patient about it lasting for 10 days, and to follow up through MyChart with  blood sugars. Educated him about adequate hydration and drinking more water over the next week. Advised patient to attend diabetic education class. Educated about watching association with increased sugars with certain foods. Advised to avoid refined carbs and sugary beverages.  2. Essential hypertension  - Comprehensive metabolic panel with GFR - Lipid panel -Educated patient to monitor blood pressure at home and let the office know if it consistently runs 140/90. Monitor for headaches and blurred vision.   3. Acute on chronic pancreatitis (HCC)  - CBC with Differential/Platelet - Comprehensive metabolic panel with  GFR - Hemoglobin A1c - C-peptide  4. Hyperglycemia due to diabetes mellitus (HCC)  - Comprehensive metabolic panel with GFR - Hemoglobin A1c - Lipid panel - Microalbumin / creatinine urine ratio - C-peptide   Return for physical with Dr. Lorin Picket on 04/25/2024. Needs work note from 3/27-4/1.  Addendum: labs were ordered after the visit and a note sent to nursing staff to let patient know and to schedule an appointment in early April for recheck on diabetes. Keep appointment with Dr. Lorin Picket for his physical.   I have seen and examined this patient alongside the NP student. I have reviewed and verified the student note and agree with the assessment and plan.  Sherie Don, FNP

## 2024-03-20 ENCOUNTER — Encounter: Payer: Self-pay | Admitting: Nurse Practitioner

## 2024-03-22 ENCOUNTER — Telehealth: Payer: Self-pay | Admitting: *Deleted

## 2024-03-22 ENCOUNTER — Telehealth: Payer: Self-pay

## 2024-03-22 NOTE — Telephone Encounter (Signed)
 Communication  Patient wants a work note saying he can return to work March 26, 2024 not March 23, 2024

## 2024-03-22 NOTE — Telephone Encounter (Signed)
 Patient stated he is returning to work 03/26/24 and declined moving appt up and wanted to keep appt 04/23/24- Patient was advised to get labs done as soon as possible

## 2024-03-22 NOTE — Telephone Encounter (Signed)
 Sherie Don C, NP     He will need to be seen the week of 4/7 as discussed in a previous note along with labs. This can be with any of the providers. He can have a work note until then and we can discuss further at his visit. Thanks.

## 2024-03-22 NOTE — Telephone Encounter (Unsigned)
 Copied from CRM [id number]. Topic: Clinical - Medical Advice >> Mar 22, 2024  9:11 AM Marland Kitchen D wrote: Patient wants a work note saying he can return to work Apr 25, 2024 not March 23, 2024

## 2024-03-22 NOTE — Telephone Encounter (Signed)
 Communication  Patient wants a work note saying he can return to work Apr 25, 2024 not March 23, 2024

## 2024-03-23 NOTE — Telephone Encounter (Signed)
 Noted thanks

## 2024-03-29 ENCOUNTER — Ambulatory Visit: Payer: Self-pay | Admitting: Family Medicine

## 2024-04-03 ENCOUNTER — Other Ambulatory Visit: Payer: Self-pay | Admitting: Family Medicine

## 2024-04-04 ENCOUNTER — Other Ambulatory Visit: Payer: Self-pay | Admitting: Family Medicine

## 2024-04-05 ENCOUNTER — Other Ambulatory Visit: Payer: Self-pay

## 2024-04-05 MED ORDER — PANTOPRAZOLE SODIUM 40 MG PO TBEC: 40.0000 mg | DELAYED_RELEASE_TABLET | Freq: Every day | ORAL | 1 refills | Status: DC

## 2024-04-05 MED ORDER — AMLODIPINE BESYLATE 5 MG PO TABS: 5.0000 mg | ORAL_TABLET | Freq: Every day | ORAL | 3 refills | Status: DC

## 2024-04-23 ENCOUNTER — Ambulatory Visit: Admitting: Family Medicine

## 2024-05-21 ENCOUNTER — Encounter: Payer: Self-pay | Admitting: Family Medicine

## 2024-05-21 ENCOUNTER — Ambulatory Visit: Admitting: Family Medicine

## 2024-05-21 VITALS — BP 136/78 | HR 92 | Temp 97.2°F | Ht 68.0 in | Wt 185.0 lb

## 2024-05-21 DIAGNOSIS — Z1211 Encounter for screening for malignant neoplasm of colon: Secondary | ICD-10-CM

## 2024-05-21 DIAGNOSIS — A084 Viral intestinal infection, unspecified: Secondary | ICD-10-CM

## 2024-05-21 MED ORDER — ROSUVASTATIN CALCIUM 10 MG PO TABS: 10.0000 mg | ORAL_TABLET | Freq: Every day | ORAL | 1 refills | Status: DC

## 2024-05-21 NOTE — Progress Notes (Signed)
   Subjective:    Patient ID: Matthew Moody, male    DOB: Jun 20, 1971, 53 y.o.   MRN: [id number]  HPI Follow up loose stool and vomiting  Abd cramps have eased up and stools today are loose to normal    Review of Systems     Objective:   Physical Exam        Assessment & Plan:

## 2024-05-21 NOTE — Progress Notes (Signed)
   Subjective:    Patient ID: Isabel Many, male    DOB: 1971/06/23, 53 y.o.   MRN: [id number]  HPI  Discussed the use of AI scribe software for clinical note transcription with the patient, who gave verbal consent to proceed.  History of Present Illness   STARLING CHRISTOFFERSON is a 53 year old male who presents with diarrhea and associated symptoms.  Symptoms began on May 19th with sudden onset diarrhea, abdominal cramping, and frequent bathroom use. The diarrhea persisted for several days, leading to fatigue and weakness. No blood in stool was noted. He experienced a fever, measured at around 100F, and episodes of nausea without vomiting.  He rested for most of the week following the onset of symptoms and attempted to contact the clinic earlier but was unable to secure an appointment until today. He has been staying hydrated by drinking plenty of fluids and avoiding sodas. Over the [facility] Day weekend, he felt slightly better but remained sluggish and stayed home with his family. His appetite was poor initially but began to improve by Monday or Tuesday, allowing him to start eating solid foods again.  He missed work from May 19th to May 27th due to his illness. He returned to work on May 28th and noted improvement with increased activity, continuing to drink plenty of fluids. He has been eating sandwiches and chips and drinking water, avoiding sodas. He received a device from his insurance to monitor his blood sugar and blood pressure, which he has started using.  No blood in stool, coughing, sore throat, or head congestion. He had a fever and nausea but no vomiting.       Review of Systems     Objective:   Physical Exam  General-in no acute distress Eyes-no discharge Lungs-respiratory rate normal, CTA CV-no murmurs,RRR Extremities skin warm dry no edema Neuro grossly normal Behavior normal, alert       Assessment & Plan:  1. Viral gastroenteritis (Primary) Patient is actually  doing better with this He missed multiple days of work we will give him a work note from the 19th through the 27th If he needs FMLA papers filled out we can do so To follow-up if progressive troubles  - Ambulatory referral to Gastroenterology  2. Screening for colon cancer Screening colonoscopy recommended.  Go ahead with referral - Ambulatory referral to Gastroenterology  To do comprehensive lab work regarding his diabetes blood pressure etc. follow-up as planned

## 2024-05-25 ENCOUNTER — Encounter: Payer: Self-pay | Admitting: Internal Medicine

## 2024-05-28 ENCOUNTER — Ambulatory Visit: Admitting: Nurse Practitioner

## 2024-06-08 ENCOUNTER — Encounter: Payer: Self-pay | Admitting: Internal Medicine

## 2024-06-25 ENCOUNTER — Emergency Department (HOSPITAL_COMMUNITY)
Admission: EM | Admit: 2024-06-25 | Discharge: 2024-06-25 | Disposition: A | Discharge status: Home / Self Care | Attending: Emergency Medicine | Admitting: Emergency Medicine

## 2024-06-25 ENCOUNTER — Encounter (HOSPITAL_COMMUNITY): Payer: Self-pay

## 2024-06-25 ENCOUNTER — Emergency Department (HOSPITAL_COMMUNITY)

## 2024-06-25 ENCOUNTER — Other Ambulatory Visit: Payer: Self-pay

## 2024-06-25 DIAGNOSIS — I10 Essential (primary) hypertension: Secondary | ICD-10-CM | POA: Insufficient documentation

## 2024-06-25 DIAGNOSIS — K610 Anal abscess: Secondary | ICD-10-CM

## 2024-06-25 DIAGNOSIS — K6289 Other specified diseases of anus and rectum: Secondary | ICD-10-CM | POA: Insufficient documentation

## 2024-06-25 DIAGNOSIS — N400 Enlarged prostate without lower urinary tract symptoms: Secondary | ICD-10-CM | POA: Diagnosis not present

## 2024-06-25 DIAGNOSIS — E119 Type 2 diabetes mellitus without complications: Secondary | ICD-10-CM | POA: Diagnosis not present

## 2024-06-25 DIAGNOSIS — K611 Rectal abscess: Secondary | ICD-10-CM | POA: Diagnosis not present

## 2024-06-25 LAB — BASIC METABOLIC PANEL WITH GFR
Anion gap: 10 (ref 5–15)
BUN: 10 mg/dL (ref 6–20)
CO2: 24 mmol/L (ref 22–32)
Calcium: 8.5 mg/dL — ABNORMAL LOW (ref 8.9–10.3)
Chloride: 99 mmol/L (ref 98–111)
Creatinine, Ser: 0.81 mg/dL (ref 0.61–1.24)
GFR, Estimated: >60 mL/min (ref >60)
Glucose, Bld: 201 mg/dL — ABNORMAL HIGH (ref 70–99)
Potassium: 4.3 mmol/L (ref 3.5–5.1)
Sodium: 133 mmol/L — ABNORMAL LOW (ref 135–145)

## 2024-06-25 LAB — CBC WITH DIFFERENTIAL/PLATELET
Abs Immature Granulocytes: 0.01 K/uL (ref 0.00–0.07)
Basophils Absolute: 0 K/uL (ref 0.0–0.1)
Basophils Relative: 0 %
Eosinophils Absolute: 0 K/uL (ref 0.0–0.5)
Eosinophils Relative: 0 %
HCT: 36.2 % — ABNORMAL LOW (ref 39.0–52.0)
Hemoglobin: 12.7 g/dL — ABNORMAL LOW (ref 13.0–17.0)
Immature Granulocytes: 0 %
Lymphocytes Relative: 9 %
Lymphs Abs: 0.8 K/uL (ref 0.7–4.0)
MCH: 33.3 pg (ref 26.0–34.0)
MCHC: 35.1 g/dL (ref 30.0–36.0)
MCV: 95 fL (ref 80.0–100.0)
Monocytes Absolute: 0.6 K/uL (ref 0.1–1.0)
Monocytes Relative: 7 %
Neutro Abs: 7 K/uL (ref 1.7–7.7)
Neutrophils Relative %: 84 %
Platelets: 216 K/uL (ref 150–400)
RBC: 3.81 MIL/uL — ABNORMAL LOW (ref 4.22–5.81)
RDW: 12.8 % (ref 11.5–15.5)
WBC: 8.4 K/uL (ref 4.0–10.5)
nRBC: 0 % (ref 0.0–0.2)

## 2024-06-25 MED ORDER — OXYCODONE HCL 5 MG PO TABS
5.0000 mg | ORAL_TABLET | ORAL | 0 refills | Status: DC | PRN
Start: 2024-06-25 — End: 2024-10-11

## 2024-06-25 MED ORDER — AMOXICILLIN-POT CLAVULANATE 875-125 MG PO TABS
1.0000 | ORAL_TABLET | Freq: Two times a day (BID) | ORAL | 0 refills | Status: DC
Start: 2024-06-25 — End: 2024-10-11

## 2024-06-25 MED ORDER — IOHEXOL 300 MG/ML  SOLN
100.0000 mL | Freq: Once | INTRAMUSCULAR | Status: AC | PRN
  Administered 2024-06-25: 100 mL via INTRAVENOUS

## 2024-06-25 MED ORDER — AMOXICILLIN-POT CLAVULANATE 875-125 MG PO TABS
1.0000 | ORAL_TABLET | Freq: Once | ORAL | Status: AC
  Administered 2024-06-25: 1 via ORAL
  Filled 2024-06-25: qty 1

## 2024-06-25 MED ORDER — HYDROCODONE-ACETAMINOPHEN 5-325 MG PO TABS
1.0000 | ORAL_TABLET | Freq: Once | ORAL | Status: AC
  Administered 2024-06-25: 1 via ORAL
  Filled 2024-06-25: qty 1

## 2024-06-25 NOTE — Discharge Instructions (Addendum)
 We evaluated you for your rectal pain.  Your testing shows that you may have a small abscess.   Since it is already draining, we do not need to drain it in the ER.  Please sitz bath a few times a day.  We have given you instructions on how to take sitz bath.  Please take Tylenol  (acetaminophen ) and Motrin  (ibuprofen ) for your symptoms at home.  You can take 1000 mg of Tylenol  every 6 hours and 600 mg of Motrin  every 6 hours as needed for your symptoms.  You can take these medicines together as needed, either at the same time, or alternating every 3 hours.  We have given you a small amount of oxycodone  which you can take for pain not relieved by Tylenol  and Motrin .  Do not drink alcohol, drive, or operate machinery when taking this medicine.  We also prescribed you a antibiotic called Augmentin .  We have given you a dose of antibiotic today.  Please take this as prescribed.  You can start taking the remainder of your antibiotics tomorrow morning.  Please return if you have any new or worsening symptoms such as fever, increasing pain or swelling, abdominal pain, lightheadedness or dizziness, or any other concerning symptoms.

## 2024-06-25 NOTE — ED Provider Notes (Signed)
  ED Course / MDM   Clinical Course as of 06/25/24 1636  Fri Jun 25, 2024  1505 Received sign out from Dr. Charlyn pending CT pelvis. Pt presenting with rectal pain and drainage, possible abscess.  [WS]  1628 CT scan shows few small perianal collection. Discussed with Dr. Kallie, who recommends discharge, since already draining no need for I&D today. Recommends sitz baths. She will follow up with patient. Discussed with patient. Will discharge patient to home. All questions answered. Patient comfortable with plan of discharge. Return precautions discussed with patient and specified on the after visit summary.  [WS]    Clinical Course User Index [WS] Francesca Elsie CROME, MD   Medical Decision Making Amount and/or Complexity of Data Reviewed Labs: ordered. Radiology: ordered.  Risk Prescription drug management.         Francesca Elsie CROME, MD 06/25/24 [phone]

## 2024-06-25 NOTE — Progress Notes (Signed)
 [facility]  Reviewed imaging. Draining perianal abscess, small 14X49mm and 15X59mm. Recommend antibiotics, return precautions. Sitz baths to help drainage and comfort.  Can follow up in office as outpatient with one of the providers. Will let the office know.   Manuelita Pander, MD [facility] [address] [place], [place] [address] [phone] (office)

## 2024-06-25 NOTE — ED Provider Notes (Signed)
 [name] Provider Note   CSN: [id number] Arrival date & time: 06/25/24  1219     Patient presents with: Abscess   Matthew Moody is a 53 y.o. male.  {Add pertinent medical, surgical, social history, OB history to HPI:32947} HPI      53 year old patient comes in with chief complaint of rectal pain.  Patient has history of alcohol use disorder, pancreatitis.  He states that he started having some pain in his rectal area about 2 days ago.  His pain was more intense yesterday.  He has been applying Preparation H.  Yesterday he got sitz bath.  This morning he started having drainage from the site of the lesion.  Patient denies any fevers, chills.  He has history of diabetes. Prior to Admission medications   Medication Sig Start Date End Date Taking? Authorizing Provider  amLODipine  (NORVASC ) 5 MG tablet Take 1 tablet (5 mg total) by mouth daily. 04/05/24  Yes Luking, Glendia DELENA, MD  metFORMIN  (GLUCOPHAGE ) 500 MG tablet TAKE 1 TABLET BY MOUTH 2 TIMES DAILY WITH A MEAL. 02/19/24  Yes Alphonsa Glendia DELENA, MD  pantoprazole  (PROTONIX ) 40 MG tablet Take 1 tablet (40 mg total) by mouth daily. 04/05/24  Yes Alphonsa Glendia DELENA, MD  Multiple Vitamin (MULTIVITAMIN WITH MINERALS) TABS tablet Take 1 tablet by mouth daily.    [provider]  rosuvastatin  (CRESTOR ) 10 MG tablet Take 1 tablet (10 mg total) by mouth daily. 05/21/24   Alphonsa Glendia DELENA, MD    Allergies: Patient has no known allergies.    Review of Systems  All other systems reviewed and are negative.   Updated Vital Signs BP (!) 171/104   Pulse 87   Temp 98.1 F (36.7 C) (Oral)   Resp 17   Ht 5' 8 (1.727 m)   Wt 87.1 kg   SpO2 96%   BMI 29.19 kg/m   Physical Exam Vitals and nursing note reviewed. Exam conducted with a chaperone present.  Constitutional:      Appearance: He is well-developed.  HENT:     Head: Atraumatic.  Cardiovascular:     Rate and Rhythm: Normal rate.   Pulmonary:     Effort: Pulmonary effort is normal.  Abdominal:     Comments: Patient has perianal lesion with no active drainage, positive tenderness to palpation  Musculoskeletal:     Cervical back: Neck supple.  Skin:    General: Skin is warm.  Neurological:     Mental Status: He is alert and oriented to person, place, and time.     (all labs ordered are listed, but only abnormal results are displayed) Labs Reviewed  CBC WITH DIFFERENTIAL/PLATELET - Abnormal; Notable for the following components:      Result Value   RBC 3.81 (*)    Hemoglobin 12.7 (*)    HCT 36.2 (*)    All other components within normal limits  BASIC METABOLIC PANEL WITH GFR    EKG: None  Radiology: No results found.  {Document cardiac monitor, telemetry assessment procedure when appropriate:32947} Procedures   Medications Ordered in the ED  HYDROcodone -acetaminophen  (NORCO/VICODIN) 5-325 MG per tablet 1 tablet (1 tablet Oral Given 06/25/24 1327)      {Click here for ABCD2, HEART and other calculators REFRESH Note before signing:1}                              Medical Decision  Making Amount and/or Complexity of Data Reviewed Labs: ordered. Radiology: ordered.  Risk Prescription drug management.   53 year old male with history of pancreatitis, diabetes, hypertension comes in with chief complaint of rectal pain and drainage.  Based on exam, differential diagnosis includes perirectal fistula, perianal abscess, perirectal abscess.  Clinically, this does not appear to be hemorrhoid.  Plan is to get CT pelvis with contrast.   Final diagnoses:  None    ED Discharge Orders     None

## 2024-06-25 NOTE — ED Triage Notes (Signed)
 Patient complains of a perirectal abscess that started about 2 days ago. Patient states that is it foul smelling yellow discharge coming out.

## 2024-07-06 ENCOUNTER — Ambulatory Visit: Payer: Self-pay | Admitting: General Surgery

## 2024-08-22 ENCOUNTER — Other Ambulatory Visit: Payer: Self-pay | Admitting: Family Medicine

## 2024-08-24 ENCOUNTER — Other Ambulatory Visit: Payer: Self-pay

## 2024-08-24 MED ORDER — METFORMIN HCL 500 MG PO TABS: 500.0000 mg | ORAL_TABLET | Freq: Two times a day (BID) | ORAL | 1 refills | Status: AC

## 2024-10-07 ENCOUNTER — Other Ambulatory Visit: Payer: Self-pay

## 2024-10-07 ENCOUNTER — Emergency Department (HOSPITAL_COMMUNITY)
Admission: EM | Admit: 2024-10-07 | Discharge: 2024-10-07 | Disposition: A | Discharge status: Home / Self Care | Attending: Emergency Medicine | Admitting: Emergency Medicine

## 2024-10-07 ENCOUNTER — Emergency Department (HOSPITAL_COMMUNITY)

## 2024-10-07 ENCOUNTER — Encounter (HOSPITAL_COMMUNITY): Payer: Self-pay

## 2024-10-07 DIAGNOSIS — M545 Low back pain, unspecified: Secondary | ICD-10-CM | POA: Diagnosis present

## 2024-10-07 DIAGNOSIS — K8689 Other specified diseases of pancreas: Secondary | ICD-10-CM | POA: Diagnosis not present

## 2024-10-07 DIAGNOSIS — R739 Hyperglycemia, unspecified: Secondary | ICD-10-CM | POA: Insufficient documentation

## 2024-10-07 DIAGNOSIS — R109 Unspecified abdominal pain: Secondary | ICD-10-CM | POA: Diagnosis not present

## 2024-10-07 DIAGNOSIS — S39012A Strain of muscle, fascia and tendon of lower back, initial encounter: Secondary | ICD-10-CM | POA: Diagnosis not present

## 2024-10-07 DIAGNOSIS — K760 Fatty (change of) liver, not elsewhere classified: Secondary | ICD-10-CM | POA: Diagnosis not present

## 2024-10-07 DIAGNOSIS — X501XXA Overexertion from prolonged static or awkward postures, initial encounter: Secondary | ICD-10-CM | POA: Insufficient documentation

## 2024-10-07 LAB — COMPREHENSIVE METABOLIC PANEL WITH GFR
ALT: 36 U/L (ref 0–44)
AST: 73 U/L — ABNORMAL HIGH (ref 15–41)
Albumin: 4.9 g/dL (ref 3.5–5.0)
Alkaline Phosphatase: 74 U/L (ref 38–126)
Anion gap: 19 — ABNORMAL HIGH (ref 5–15)
BUN: 15 mg/dL (ref 6–20)
CO2: 24 mmol/L (ref 22–32)
Calcium: 9.1 mg/dL (ref 8.9–10.3)
Chloride: 95 mmol/L — ABNORMAL LOW (ref 98–111)
Creatinine, Ser: 0.91 mg/dL (ref 0.61–1.24)
GFR, Estimated: >60 mL/min (ref >60)
Glucose, Bld: 221 mg/dL — ABNORMAL HIGH (ref 70–99)
Potassium: 4.3 mmol/L (ref 3.5–5.1)
Sodium: 137 mmol/L (ref 135–145)
Total Bilirubin: 0.6 mg/dL (ref 0.0–1.2)
Total Protein: 7.7 g/dL (ref 6.5–8.1)

## 2024-10-07 LAB — URINALYSIS, ROUTINE W REFLEX MICROSCOPIC
Bacteria, UA: NONE SEEN
Bilirubin Urine: NEGATIVE
Glucose, UA: >=500 mg/dL — AB
Hgb urine dipstick: NEGATIVE
Ketones, ur: 20 mg/dL — AB
Leukocytes,Ua: NEGATIVE
Nitrite: NEGATIVE
Protein, ur: 30 mg/dL — AB
Specific Gravity, Urine: 1.022 (ref 1.005–1.030)
pH: 5 (ref 5.0–8.0)

## 2024-10-07 LAB — CBC WITH DIFFERENTIAL/PLATELET
Abs Immature Granulocytes: 0.02 K/uL (ref 0.00–0.07)
Basophils Absolute: 0 K/uL (ref 0.0–0.1)
Basophils Relative: 1 %
Eosinophils Absolute: 0.2 K/uL (ref 0.0–0.5)
Eosinophils Relative: 3 %
HCT: 41.6 % (ref 39.0–52.0)
Hemoglobin: 14.4 g/dL (ref 13.0–17.0)
Immature Granulocytes: 0 %
Lymphocytes Relative: 12 %
Lymphs Abs: 0.9 K/uL (ref 0.7–4.0)
MCH: 32.5 pg (ref 26.0–34.0)
MCHC: 34.6 g/dL (ref 30.0–36.0)
MCV: 93.9 fL (ref 80.0–100.0)
Monocytes Absolute: 0.4 K/uL (ref 0.1–1.0)
Monocytes Relative: 6 %
Neutro Abs: 6.1 K/uL (ref 1.7–7.7)
Neutrophils Relative %: 78 %
Platelets: 240 K/uL (ref 150–400)
RBC: 4.43 MIL/uL (ref 4.22–5.81)
RDW: 12.6 % (ref 11.5–15.5)
WBC: 7.8 K/uL (ref 4.0–10.5)
nRBC: 0 % (ref 0.0–0.2)

## 2024-10-07 LAB — LIPASE, BLOOD: Lipase: 14 U/L (ref 11–51)

## 2024-10-07 MED ORDER — IBUPROFEN 600 MG PO TABS: 600.0000 mg | ORAL_TABLET | Freq: Three times a day (TID) | ORAL | 0 refills | Status: AC | PRN

## 2024-10-07 MED ORDER — METHOCARBAMOL 500 MG PO TABS: 500.0000 mg | ORAL_TABLET | Freq: Three times a day (TID) | ORAL | 0 refills | Status: DC | PRN

## 2024-10-07 NOTE — ED Provider Notes (Signed)
 [place] EMERGENCY DEPARTMENT AT [facility] Provider Note   CSN: [id number] Arrival date & time: 10/07/24  1302     Patient presents with: Flank Pain   Matthew Moody is a 53 y.o. male.  Matthew Moody is here with a complaint of left-sided low back pain has been going on for 3 days.  Matthew Moody does not recall any trauma.  Worse with bending and twisting.  Does not radiate into abdomen.  Sometimes will go down his left thigh.  No bowel or bladder symptoms.  No fevers or chills.  Tried some over-the-counter ibuprofen  without improvement.  Has not been able to work due to the pain.  No urinary symptoms.   The history is provided by the patient.  Flank Pain The current episode started more than 2 days ago. The problem occurs constantly. The problem has not changed since onset.Pertinent negatives include no chest pain, no abdominal pain, no headaches and no shortness of breath. The symptoms are aggravated by bending and twisting. Nothing relieves the symptoms. Treatments tried: ibuprofen . The treatment provided no relief.       Prior to Admission medications   Medication Sig Start Date End Date Taking? Authorizing Provider  acetaminophen  (TYLENOL ) 500 MG tablet Take 500 mg by mouth every 6 (six) hours as needed.    [provider]  amLODipine  (NORVASC ) 5 MG tablet Take 1 tablet (5 mg total) by mouth daily. 04/05/24   Alphonsa Glendia DELENA, MD  amoxicillin -clavulanate (AUGMENTIN ) 875-125 MG tablet Take 1 tablet by mouth every 12 (twelve) hours. 06/25/24   Francesca Elsie CROME, MD  ibuprofen  (ADVIL ) 200 MG tablet Take 400 mg by mouth every 6 (six) hours as needed.    [provider]  metFORMIN  (GLUCOPHAGE ) 500 MG tablet Take 1 tablet (500 mg total) by mouth 2 (two) times daily with a meal. 08/24/24   Luking, Glendia DELENA, MD  Multiple Vitamin (MULTIVITAMIN WITH MINERALS) TABS tablet Take 1 tablet by mouth daily.    [provider]  oxyCODONE  (ROXICODONE ) 5 MG immediate release tablet Take  1 tablet (5 mg total) by mouth every 4 (four) hours as needed for severe pain (pain score 7-10). 06/25/24   Francesca Elsie CROME, MD  pantoprazole  (PROTONIX ) 40 MG tablet Take 1 tablet (40 mg total) by mouth daily. 04/05/24   Alphonsa Glendia DELENA, MD  rosuvastatin  (CRESTOR ) 10 MG tablet Take 1 tablet (10 mg total) by mouth daily. 05/21/24   Alphonsa Glendia DELENA, MD    Allergies: Patient has no known allergies.    Review of Systems  Respiratory:  Negative for shortness of breath.   Cardiovascular:  Negative for chest pain.  Gastrointestinal:  Negative for abdominal pain.  Genitourinary:  Positive for flank pain. Negative for dysuria and hematuria.  Musculoskeletal:  Positive for back pain.  Neurological:  Negative for headaches.    Updated Vital Signs BP (!) 181/99   Pulse 87   Temp 97.9 F (36.6 C)   Resp 15   Ht 5' 8 (1.727 m)   Wt 87.1 kg   SpO2 100%   BMI 29.19 kg/m   Physical Exam Vitals and nursing note reviewed.  Constitutional:      General: Matthew Moody is not in acute distress.    Appearance: Matthew Moody is well-developed.  HENT:     Head: Normocephalic and atraumatic.  Eyes:     Conjunctiva/sclera: Conjunctivae normal.  Cardiovascular:     Rate and Rhythm: Normal rate and regular rhythm.  Heart sounds: No murmur heard. Pulmonary:     Effort: Pulmonary effort is normal. No respiratory distress.     Breath sounds: Normal breath sounds.  Abdominal:     Palpations: Abdomen is soft.     Tenderness: There is no abdominal tenderness. There is no guarding or rebound.  Musculoskeletal:        General: Tenderness present. No deformity.     Cervical back: Neck supple.     Comments: Matthew Moody has no midline spine tenderness.  Matthew Moody does have some left paralumbar tenderness  Skin:    General: Skin is warm and dry.     Capillary Refill: Capillary refill takes less than 2 seconds.  Neurological:     Mental Status: Matthew Moody is alert.     Sensory: No sensory deficit.     Motor: No weakness.     (all labs  ordered are listed, but only abnormal results are displayed) Labs Reviewed  URINALYSIS, ROUTINE W REFLEX MICROSCOPIC - Abnormal; Notable for the following components:      Result Value   Glucose, UA >=500 (*)    Ketones, ur 20 (*)    Protein, ur 30 (*)    All other components within normal limits  COMPREHENSIVE METABOLIC PANEL WITH GFR - Abnormal; Notable for the following components:   Chloride 95 (*)    Glucose, Bld 221 (*)    AST 73 (*)    Anion gap 19 (*)    All other components within normal limits  CBC WITH DIFFERENTIAL/PLATELET  LIPASE, BLOOD    EKG: None  Radiology: CT Renal Stone Study Result Date: 10/07/2024 EXAM: CT UROGRAM 10/07/2024 02:08:55 PM TECHNIQUE: CT of the abdomen and pelvis was performed without the administration of intravenous contrast as per CT urogram protocol. Multiplanar reformatted images as well as MIP urogram images are provided for review. Automated exposure control, iterative reconstruction, and/or weight based adjustment of the mA/kV was utilized to reduce the radiation dose to as low as reasonably achievable. COMPARISON: 11/24/2018 and 06/25/2024 CLINICAL HISTORY: Abdominal/flank pain, stone suspected. c/o left flank pain since Monday. Pt denies hematuria denies dysuria, and denies injury. FINDINGS: LOWER CHEST: No acute abnormality. LIVER: Hepatic steatosis. GALLBLADDER AND BILE DUCTS: Gallbladder is unremarkable. No biliary ductal dilatation. SPLEEN: No acute abnormality. PANCREAS: Pancreatic calcifications consistent with chronic pancreatitis. ADRENAL GLANDS: No acute abnormality. KIDNEYS, URETERS AND BLADDER: No stones in the kidneys or ureters. No hydronephrosis. No perinephric or periureteral stranding. Urinary bladder is unremarkable. GI AND BOWEL: Stomach demonstrates no acute abnormality. There is no bowel obstruction. PERITONEUM AND RETROPERITONEUM: No ascites. No free air. VASCULATURE: Aorta is normal in caliber. LYMPH NODES: No lymphadenopathy.  REPRODUCTIVE ORGANS: No acute abnormality. BONES AND SOFT TISSUES: No acute osseous abnormality. No focal soft tissue abnormality. IMPRESSION: 1. No nephrolithiasis or obstructive uropathy. 2. Hepatic steatosis. 3. Pancreatic calcifications consistent with chronic pancreatitis. Electronically signed by: Lynwood Seip MD 10/07/2024 02:28 PM EDT RP Workstation: [id number]     Procedures   Medications Ordered in the ED - No data to display  Clinical Course as of 10/08/24 0933  Thu Oct 07, 2024  1702 Patient's workup does not show any evidence of kidney stone or pyelo-.  Location of pain seems more paralumbar.  Will have him treat with NSAIDs muscle relaxant and PCP follow-up.  Return instructions discussed [MB]    Clinical Course User Index [MB] Towana Ozell BROCKS, MD  Medical Decision Making Amount and/or Complexity of Data Reviewed Labs: ordered. Radiology: ordered.  Risk Prescription drug management.   This patient complains of left-sided low back pain; this involves an extensive number of treatment Options and is a complaint that carries with it a high risk of complications and morbidity. The differential includes musculoskeletal pain, radiculopathy, pyelonephritis, renal colic  I ordered, reviewed and interpreted labs, which included CBC normal chemistries normal with an elevated glucose, urinalysis without signs of infection or hematuria  I ordered imaging studies which included CT renal and I independently    visualized and interpreted imaging which showed no acute findings Additional history obtained from patient's companion Previous records obtained and reviewed in epic including recent ED and PCP notes Cardiac monitoring reviewed, sinus rhythm Social determinants considered, Food insecurity Critical Interventions: None  After the interventions stated above, I reevaluated the patient and found patient to be well-appearing and in no distress  neurovascularly intact Admission and further testing considered, no indications for admission.  Will treat symptomatically NSAIDs muscle relaxant and recommended close follow-up with PCP.  Return instructions discussed      Final diagnoses:  Strain of lumbar region, initial encounter    ED Discharge Orders          Ordered    ibuprofen  (ADVIL ) 600 MG tablet  Every 8 hours PRN        10/07/24 1704    methocarbamol (ROBAXIN) 500 MG tablet  Every 8 hours PRN        10/07/24 1704               Milik Gilreath C, MD 10/08/24 [phone]

## 2024-10-07 NOTE — ED Triage Notes (Signed)
 Pt arrived via POV c/o left flank pain since Monday. Pt denies hematuria denies dysuria, and denies injury.

## 2024-10-07 NOTE — Discharge Instructions (Signed)
 You can try ice or heat to the area.  Ibuprofen  with food.  Muscle relaxant.  Follow-up with your primary care doctor.  Return if any worsening or concerning symptoms

## 2024-10-08 ENCOUNTER — Other Ambulatory Visit: Payer: Self-pay | Admitting: Family Medicine

## 2024-10-11 ENCOUNTER — Ambulatory Visit: Admitting: Nurse Practitioner

## 2024-10-11 VITALS — BP 155/95 | HR 97 | Temp 97.9°F | Ht 68.0 in | Wt 183.0 lb

## 2024-10-11 DIAGNOSIS — M545 Low back pain, unspecified: Secondary | ICD-10-CM | POA: Diagnosis not present

## 2024-10-11 DIAGNOSIS — Z794 Long term (current) use of insulin: Secondary | ICD-10-CM | POA: Diagnosis not present

## 2024-10-11 DIAGNOSIS — E119 Type 2 diabetes mellitus without complications: Secondary | ICD-10-CM

## 2024-10-11 DIAGNOSIS — Z09 Encounter for follow-up examination after completed treatment for conditions other than malignant neoplasm: Secondary | ICD-10-CM | POA: Diagnosis not present

## 2024-10-11 LAB — URINALYSIS
Leukocytes,UA: NEGATIVE
Nitrite, UA: NEGATIVE
Specific Gravity, UA: 1.025 (ref 1.005–1.030)
Urobilinogen, Ur: 0.2 mg/dL (ref 0.2–1.0)
pH, UA: 5.5 (ref 5.0–7.5)

## 2024-10-11 LAB — GLUCOSE HEMOCUE WAIVED: Glu Hemocue Waived: 221 mg/dL — ABNORMAL HIGH (ref 70–99)

## 2024-10-11 LAB — BAYER DCA HB A1C WAIVED: HB A1C (BAYER DCA - WAIVED): 9 % — ABNORMAL HIGH (ref 4.8–5.6)

## 2024-10-11 MED ORDER — LANCETS MISC
1.0000 | 0 refills | Status: AC
Start: 2024-10-11 — End: ?

## 2024-10-11 MED ORDER — KETOROLAC TROMETHAMINE 60 MG/2ML IM SOLN
60.0000 mg | Freq: Once | INTRAMUSCULAR | Status: AC
  Administered 2024-10-11: 60 mg via INTRAMUSCULAR

## 2024-10-11 MED ORDER — MELOXICAM 15 MG PO TABS: 15.0000 mg | ORAL_TABLET | Freq: Every day | ORAL | 2 refills | Status: DC

## 2024-10-11 MED ORDER — BLOOD GLUCOSE MONITORING SUPPL DEVI: 1.0000 | 0 refills | Status: AC

## 2024-10-11 MED ORDER — BLOOD GLUCOSE TEST VI STRP: 1.0000 | ORAL_STRIP | 0 refills | Status: DC

## 2024-10-11 MED ORDER — LANTUS SOLOSTAR 100 UNIT/ML ~~LOC~~ SOPN: 10.0000 [IU] | PEN_INJECTOR | Freq: Every day | SUBCUTANEOUS | 99 refills | Status: DC

## 2024-10-11 MED ORDER — LANCET DEVICE MISC
1.0000 | 0 refills | Status: AC
Start: 2024-10-11 — End: ?

## 2024-10-11 NOTE — Progress Notes (Signed)
 Subjective:    Patient ID: Matthew Moody, male    DOB: 04/14/1971, 53 y.o.   MRN: [id number]   Chief Complaint: Hospitalization Follow-up   HPI  Patient went to the ED on 10/07/24 with low back pain. He denies any injury. The pain had actually started several days prior to that. They did scan for kidney stone which was negative.  Labs showed elevated blood sugar at 221 with ketones in urine. They do not check his blood sugar at home. They did not give him anything and discharged him home. Gave him muscle relaxer.  - back pain- still bothering him today. Rates pain 7/10 today. Muscle relaxer prescribed helped minimally. Getting up from sitting or laying increases pain. - ketones in urine - elevated blood sugar. His last HGBA1c was in 06/2023 and was 12.7 Patient Active Problem List   Diagnosis Date Noted   Class 1 obesity due to excess calories without serious comorbidity with body mass index (BMI) of 31.0 to 31.9 in adult    Hypomagnesemia    Acute on chronic pancreatitis (HCC) 11/24/2018   Hypokalemia 12/28/2017   Benign essential HTN    Pancreatitis, recurrent 12/27/2017   Accelerated hypertension 12/27/2017   Acute pancreatitis 12/27/2017   Acute alcoholic pancreatitis 12/27/2017   Alcohol dependence (HCC) 12/27/2017   Essential hypertension 12/27/2017   Hyperglycemia due to diabetes mellitus (HCC) 12/27/2017       Review of Systems  Constitutional:  Negative for diaphoresis.  Eyes:  Negative for pain.  Respiratory:  Negative for shortness of breath.   Cardiovascular:  Negative for chest pain, palpitations and leg swelling.  Gastrointestinal:  Negative for abdominal pain.  Endocrine: Negative for polydipsia.  Skin:  Negative for rash.  Neurological:  Negative for dizziness, weakness and headaches.  Hematological:  Does not bruise/bleed easily.  All other systems reviewed and are negative.      Objective:   Physical Exam Constitutional:      Appearance: Normal  appearance.  Cardiovascular:     Rate and Rhythm: Normal rate and regular rhythm.     Heart sounds: Normal heart sounds.  Pulmonary:     Breath sounds: Normal breath sounds.  Skin:    General: Skin is warm.  Neurological:     General: No focal deficit present.     Mental Status: He is alert and oriented to person, place, and time.  Psychiatric:        Behavior: Behavior normal.     BP (!) 155/95   Pulse 97   Temp 97.9 F (36.6 C) (Temporal)   Ht 5' 8 (1.727 m)   Wt 183 lb (83 kg)   SpO2 95%   BMI 27.83 kg/m        Assessment & Plan:   Matthew Moody in today with chief complaint of Hospitalization Follow-up   1. Type 2 diabetes mellitus without complication, without long-term current use of insulin (HCC) (Primary) Check fasting blood sugars every morning and keep diary Carb counting discussed Start lantus 10u nightly Continue metformin  for now RTO in 1 week to recheck - Bayer DCA Hb A1c Waived - Glucose Hemocue Waived - Urinalysis - insulin glargine (LANTUS SOLOSTAR) 100 UNIT/ML Solostar Pen; Inject 10 Units into the skin daily.  Dispense: 15 mL; Refill: PRN - CMP14 + Anion Gap  2. Acute midline low back pain without sciatica Moist heat Est No heavy lifting, bending or stooping RTO in 1 week to recheck - ketorolac  (TORADOL ) injection 60  mg - meloxicam (MOBIC) 15 MG tablet; Take 1 tablet (15 mg total) by mouth daily.  Dispense: 30 tablet; Refill: 2  3. Hospital follow up Hospital records reviewed   4. hypertension Double up on amlodipine  until next visit. Keep diary of blood pressure eat random times - bring  that with you on follow up  Consulted with Dr. Jolinda The above assessment and management plan was discussed with the patient. The patient verbalized understanding of and has agreed to the management plan. Patient is aware to call the clinic if symptoms persist or worsen. Patient is aware when to return to the clinic for a follow-up visit.  Patient educated on when it is appropriate to go to the emergency department.   Mary-Margaret Gladis, FNP

## 2024-10-11 NOTE — Addendum Note (Signed)
 Addended by: VIKTORIA ALAN MATSU on: 10/11/2024 11:15 AM   Modules accepted: Orders

## 2024-10-11 NOTE — Patient Instructions (Signed)

## 2024-10-12 ENCOUNTER — Ambulatory Visit: Payer: Self-pay | Admitting: Nurse Practitioner

## 2024-10-12 LAB — CMP14 + ANION GAP
ALT: 25 IU/L (ref 0–44)
AST: 36 IU/L (ref 0–40)
Albumin: 4.6 g/dL (ref 3.8–4.9)
Alkaline Phosphatase: 72 IU/L (ref 47–123)
Anion Gap: 23 mmol/L — ABNORMAL HIGH (ref 10.0–18.0)
BUN/Creatinine Ratio: 21 — ABNORMAL HIGH (ref 9–20)
BUN: 16 mg/dL (ref 6–24)
Bilirubin Total: 0.3 mg/dL (ref 0.0–1.2)
CO2: 18 mmol/L — ABNORMAL LOW (ref 20–29)
Calcium: 9.1 mg/dL (ref 8.7–10.2)
Chloride: 99 mmol/L (ref 96–106)
Creatinine, Ser: 0.75 mg/dL — ABNORMAL LOW (ref 0.76–1.27)
Globulin, Total: 2.4 g/dL (ref 1.5–4.5)
Glucose: 197 mg/dL — ABNORMAL HIGH (ref 70–99)
Potassium: 4.2 mmol/L (ref 3.5–5.2)
Sodium: 140 mmol/L (ref 134–144)
Total Protein: 7 g/dL (ref 6.0–8.5)
eGFR: 109 mL/min/1.73 (ref >59)

## 2024-10-13 ENCOUNTER — Telehealth: Payer: Self-pay | Admitting: Family Medicine

## 2024-10-13 NOTE — Telephone Encounter (Signed)
 Patient has been seen twice by another office for his diabetes  He needs to have a follow-up visit with us  mid [facility] December please set him up to follow-up with myself same-day slot  Or if that is not possible with [place] thank you

## 2024-10-14 NOTE — Telephone Encounter (Signed)
 Patient states he is following up with Dr who he has been seeing at [facility] for this concern- has appointment 10/19/24  and will schedule a follow up with Dr Glendia in the future when needed

## 2024-10-14 NOTE — Telephone Encounter (Signed)
Some noted thank you °

## 2024-10-19 ENCOUNTER — Ambulatory Visit: Admitting: Nurse Practitioner

## 2024-10-19 ENCOUNTER — Encounter: Payer: Self-pay | Admitting: Nurse Practitioner

## 2024-10-19 VITALS — BP 136/86 | HR 89 | Temp 98.1°F | Ht 68.0 in | Wt 186.0 lb

## 2024-10-19 DIAGNOSIS — Z794 Long term (current) use of insulin: Secondary | ICD-10-CM

## 2024-10-19 DIAGNOSIS — Z8639 Personal history of other endocrine, nutritional and metabolic disease: Secondary | ICD-10-CM | POA: Diagnosis not present

## 2024-10-19 DIAGNOSIS — E1169 Type 2 diabetes mellitus with other specified complication: Secondary | ICD-10-CM | POA: Diagnosis not present

## 2024-10-19 LAB — URINALYSIS
Leukocytes,UA: NEGATIVE
Nitrite, UA: NEGATIVE
Specific Gravity, UA: 1.025 (ref 1.005–1.030)
Urobilinogen, Ur: 1 mg/dL (ref 0.2–1.0)
pH, UA: 6 (ref 5.0–7.5)

## 2024-10-19 MED ORDER — LANTUS SOLOSTAR 100 UNIT/ML ~~LOC~~ SOPN: 15.0000 [IU] | PEN_INJECTOR | Freq: Every day | SUBCUTANEOUS | 99 refills | Status: DC

## 2024-10-19 MED ORDER — FREESTYLE LIBRE 3 PLUS SENSOR MISC: 3 refills | Status: DC

## 2024-10-19 NOTE — Progress Notes (Signed)
 Subjective:    Patient ID: Matthew Moody, male    DOB: Jan 03, 1971, 53 y.o.   MRN: [id number]   Chief Complaint: diabetes   HPI:  Matthew Moody is a 53 y.o. who identifies as a male who was assigned male at birth.   Social history: Lives with: wife  This is a new patient to our office being seen for the second time. He was seen last week for hospital follow up because his PCP (Dr. Alphonsa) had no openings for him to be seen. Patient had gone to the ED for back pain. Was found to be in ketoacidosis form elevated blood sugar while he was there. He was seen here for follow up. His anion gap was >20,blood sugar was 198 and he still had ketones in his urine. He was given a glucometer to check blood sugars at home. He was started on lantus 10u at bedtime along with his metformin  500 BID. Dr, eulogio was informed of patients condition and he will eventually return there for his care. Since adding lantis his blood sugars have been running 140-170 fasting. He feels some better.      New complaints: None today  No Known Allergies Outpatient Encounter Medications as of 10/19/2024  Medication Sig   amLODipine  (NORVASC ) 5 MG tablet Take 1 tablet (5 mg total) by mouth daily.   Blood Glucose Monitoring Suppl DEVI 1 each by Does not apply route as directed. Dispense based on patient and insurance preference. Use up to four times daily as directed. (FOR ICD-10 E10.9, E11.9).   Glucose Blood (BLOOD GLUCOSE TEST STRIPS) STRP 1 each by Does not apply route as directed. Dispense based on patient and insurance preference. Use up to four times daily as directed. (FOR ICD-10 E10.9, E11.9).   ibuprofen  (ADVIL ) 600 MG tablet Take 1 tablet (600 mg total) by mouth every 8 (eight) hours as needed.   insulin glargine (LANTUS SOLOSTAR) 100 UNIT/ML Solostar Pen Inject 10 Units into the skin daily.   Lancet Device MISC 1 each by Does not apply route as directed. Dispense based on patient and insurance preference. Use  up to four times daily as directed. (FOR ICD-10 E10.9, E11.9).   Lancets MISC 1 each by Does not apply route as directed. Dispense based on patient and insurance preference. Use up to four times daily as directed. (FOR ICD-10 E10.9, E11.9).   meloxicam (MOBIC) 15 MG tablet Take 1 tablet (15 mg total) by mouth daily.   metFORMIN  (GLUCOPHAGE ) 500 MG tablet Take 1 tablet (500 mg total) by mouth 2 (two) times daily with a meal.   methocarbamol (ROBAXIN) 500 MG tablet Take 1 tablet (500 mg total) by mouth every 8 (eight) hours as needed for muscle spasms.   Multiple Vitamin (MULTIVITAMIN WITH MINERALS) TABS tablet Take 1 tablet by mouth daily.   pantoprazole  (PROTONIX ) 40 MG tablet TAKE 1 TABLET BY MOUTH EVERY DAY   rosuvastatin  (CRESTOR ) 10 MG tablet Take 1 tablet (10 mg total) by mouth daily.   No facility-administered encounter medications on file as of 10/19/2024.    No past surgical history on file.  Family History  Problem Relation Age of Onset   Hypertension Mother       Controlled substance contract: n/a      Review of Systems  Constitutional:  Negative for diaphoresis.  Eyes:  Negative for pain.  Respiratory:  Negative for shortness of breath.   Cardiovascular:  Negative for chest pain, palpitations and leg swelling.  Gastrointestinal:  Negative for abdominal pain.  Endocrine: Negative for polydipsia.  Skin:  Negative for rash.  Neurological:  Negative for dizziness, weakness and headaches.  Hematological:  Does not bruise/bleed easily.  All other systems reviewed and are negative.      Objective:   Physical Exam Vitals and nursing note reviewed.  Constitutional:      Appearance: Normal appearance. He is well-developed.  Neck:     Thyroid : No thyroid  mass or thyromegaly.     Vascular: No carotid bruit or JVD.     Trachea: Phonation normal.  Cardiovascular:     Rate and Rhythm: Normal rate and regular rhythm.  Pulmonary:     Effort: Pulmonary effort is normal.  No respiratory distress.     Breath sounds: Normal breath sounds.  Abdominal:     General: Bowel sounds are normal.     Palpations: Abdomen is soft.     Tenderness: There is no abdominal tenderness.  Musculoskeletal:        General: Normal range of motion.     Cervical back: Normal range of motion and neck supple.  Lymphadenopathy:     Cervical: No cervical adenopathy.  Skin:    General: Skin is warm and dry.  Neurological:     Mental Status: He is alert and oriented to person, place, and time.  Psychiatric:        Behavior: Behavior normal.        Thought Content: Thought content normal.        Judgment: Judgment normal.    BP (!) 158/96   Pulse 89   Temp 98.1 F (36.7 C) (Temporal)   Ht 5' 8 (1.727 m)   Wt 186 lb (84.4 kg)   SpO2 98%   BMI 28.28 kg/m         Assessment & Plan:   Matthew Moody in today with chief complaint of medical management of chronic issues    1. History of diabetic ketoacidosis (Primary) Still ketones in urine but patient much better - Urinalysis  2. Type 2 diabetes mellitus with other specified complication, without long-term current use of insulin (HCC) Ordered CGM - can return to be shown how  to use.  - Continuous Glucose Sensor (FREESTYLE LIBRE 3 PLUS SENSOR) MISC; Change sensor every 15 days.  Dispense: 6 each; Refill: 3 - insulin glargine (LANTUS SOLOSTAR) 100 UNIT/ML Solostar Pen; Inject 15 Units into the skin daily.  Dispense: 15 mL; Refill: PRN    The above assessment and management plan was discussed with the patient. The patient verbalized understanding of and has agreed to the management plan. Patient is aware to call the clinic if symptoms persist or worsen. Patient is aware when to return to the clinic for a follow-up visit. Patient educated on when it is appropriate to go to the emergency department.   Mary-Margaret Gladis, FNP

## 2024-10-21 ENCOUNTER — Ambulatory Visit: Payer: Self-pay | Admitting: Nurse Practitioner

## 2024-10-22 DIAGNOSIS — Z0279 Encounter for issue of other medical certificate: Secondary | ICD-10-CM

## 2024-10-28 ENCOUNTER — Telehealth: Payer: Self-pay | Admitting: Nurse Practitioner

## 2024-10-28 ENCOUNTER — Telehealth: Payer: Self-pay

## 2024-10-28 DIAGNOSIS — Z0289 Encounter for other administrative examinations: Secondary | ICD-10-CM

## 2024-10-28 NOTE — Telephone Encounter (Signed)
 [employer] dropped off diability forms to be completed and signed.  Form Fee Paid? (Y/N)       y     If NO, form is placed on front office manager desk to hold until payment received. If YES, then form will be placed in the RX/HH Nurse Coordinators box for completion.  Form will not be processed until payment is received

## 2024-10-28 NOTE — Telephone Encounter (Signed)
 Matthew Moody, is there a cheaper option for patient?   Copied from CRM [id number]. Topic: Clinical - Medication Question >> Oct 28, 2024 11:23 AM Matthew Moody wrote: Reason for CRM: Patient's wife, called. They went to pick up the Freestyle Libre 3 Sensor, and it's $224 out of pocket. If there's anyway they can discuss getting a cheaper alternative, please call back the patient to discuss. >> Oct 28, 2024 11:26 AM Matthew Moody wrote: Matthew Moody [phone] is the second alt number to call

## 2024-10-28 NOTE — Telephone Encounter (Signed)
 [employer] faxed Disability forms to be completed and signed.  Form Fee Paid? (Y/N)       y     If NO, form is placed on front office manager desk to hold until payment received. If YES, then form will be placed in the RX/HH Nurse Coordinators box for completion.  Form will not be processed until payment is received

## 2024-10-29 ENCOUNTER — Telehealth: Payer: Self-pay | Admitting: Pharmacist

## 2024-10-29 ENCOUNTER — Encounter: Payer: Self-pay | Admitting: Pharmacist

## 2024-10-29 NOTE — Telephone Encounter (Signed)
 Libre copay card provided to patient via my chart message   Mliss Tarry Griffin, PharmD, BCACP, CPP Clinical Pharmacist, [facility]

## 2024-10-29 NOTE — Telephone Encounter (Signed)
 My chart message sent to patient with Matthew Moody card attached

## 2024-11-02 ENCOUNTER — Other Ambulatory Visit: Payer: Self-pay | Admitting: Nurse Practitioner

## 2024-11-04 NOTE — Telephone Encounter (Signed)
 NA / VM full paperwork to [facility] faxed on 10/29/24 to [phone]

## 2024-11-04 NOTE — Telephone Encounter (Signed)
 NA / VM not set up paperwork to [facility] faxed on 10/29/24 to [phone]

## 2024-11-08 ENCOUNTER — Ambulatory Visit: Admitting: Nurse Practitioner

## 2024-11-08 ENCOUNTER — Encounter: Payer: Self-pay | Admitting: Nurse Practitioner

## 2024-11-08 VITALS — BP 159/90 | HR 85 | Temp 97.8°F | Ht 68.0 in | Wt 186.0 lb

## 2024-11-08 DIAGNOSIS — Z794 Long term (current) use of insulin: Secondary | ICD-10-CM

## 2024-11-08 DIAGNOSIS — E1169 Type 2 diabetes mellitus with other specified complication: Secondary | ICD-10-CM

## 2024-11-08 NOTE — Progress Notes (Signed)
   Subjective:    Patient ID: Matthew Moody, male    DOB: 26-Jan-1971, 53 y.o.   MRN: [id number]   Chief Complaint: diabetes recheck  HPI  Patient has been out of work for several weeks with ketoacidosis. He was recently started on lantus. His last hgba1c was 9.0%. his blood sugars are running 140's and below. He has had a couple of reading around 190 before going to bed.  Lab Results  Component Value Date   HGBA1C 9.0 (H) 10/11/2024    Patient Active Problem List   Diagnosis Date Noted   Class 1 obesity due to excess calories without serious comorbidity with body mass index (BMI) of 31.0 to 31.9 in adult    Hypomagnesemia    Acute on chronic pancreatitis (HCC) 11/24/2018   Hypokalemia 12/28/2017   Benign essential HTN    Pancreatitis, recurrent 12/27/2017   Accelerated hypertension 12/27/2017   Acute pancreatitis 12/27/2017   Acute alcoholic pancreatitis 12/27/2017   Alcohol dependence (HCC) 12/27/2017   Essential hypertension 12/27/2017   Hyperglycemia due to diabetes mellitus (HCC) 12/27/2017       Review of Systems  Constitutional:  Negative for diaphoresis.  Eyes:  Negative for pain.  Respiratory:  Negative for shortness of breath.   Cardiovascular:  Negative for chest pain, palpitations and leg swelling.  Gastrointestinal:  Negative for abdominal pain.  Endocrine: Negative for polydipsia.  Skin:  Negative for rash.  Neurological:  Negative for dizziness, weakness and headaches.  Hematological:  Does not bruise/bleed easily.  All other systems reviewed and are negative.      Objective:   Physical Exam Constitutional:      Appearance: Normal appearance.  Cardiovascular:     Rate and Rhythm: Normal rate and regular rhythm.     Heart sounds: Normal heart sounds.  Pulmonary:     Breath sounds: Normal breath sounds.  Skin:    General: Skin is warm.  Neurological:     General: No focal deficit present.     Mental Status: He is alert and oriented to person,  place, and time.  Psychiatric:        Mood and Affect: Mood normal.        Behavior: Behavior normal.    BP (!) 159/90   Pulse 85   Temp 97.8 F (36.6 C) (Temporal)   Ht 5' 8 (1.727 m)   Wt 186 lb (84.4 kg)   SpO2 97%   BMI 28.28 kg/m   Hgba1c 8.2%       Assessment & Plan:   Matthew Moody in today with chief complaint of diabetes  1. Type 2 diabetes mellitus with other specified complication, without long-term current use of insulin (HCC) (Primary) We put libre on patient today for him to try. OK to go back to work Continue current dose of lantus for  now. - Bayer DCA Hb A1c Waived    The above assessment and management plan was discussed with the patient. The patient verbalized understanding of and has agreed to the management plan. Patient is aware to call the clinic if symptoms persist or worsen. Patient is aware when to return to the clinic for a follow-up visit. Patient educated on when it is appropriate to go to the emergency department.   Mary-Margaret Gladis, FNP

## 2024-11-09 LAB — BAYER DCA HB A1C WAIVED: HB A1C (BAYER DCA - WAIVED): 8.2 % — ABNORMAL HIGH (ref 4.8–5.6)

## 2024-11-16 ENCOUNTER — Ambulatory Visit

## 2024-11-29 ENCOUNTER — Other Ambulatory Visit: Payer: Self-pay | Admitting: Nurse Practitioner

## 2024-12-01 ENCOUNTER — Other Ambulatory Visit: Payer: Self-pay

## 2024-12-01 ENCOUNTER — Emergency Department (HOSPITAL_COMMUNITY)
Admission: EM | Admit: 2024-12-01 | Discharge: 2024-12-01 | Disposition: A | Discharge status: Home / Self Care | Attending: Emergency Medicine | Admitting: Emergency Medicine

## 2024-12-01 ENCOUNTER — Telehealth: Payer: Self-pay

## 2024-12-01 ENCOUNTER — Encounter (HOSPITAL_COMMUNITY): Payer: Self-pay

## 2024-12-01 ENCOUNTER — Ambulatory Visit: Payer: Self-pay

## 2024-12-01 DIAGNOSIS — E11649 Type 2 diabetes mellitus with hypoglycemia without coma: Secondary | ICD-10-CM | POA: Insufficient documentation

## 2024-12-01 DIAGNOSIS — Z794 Long term (current) use of insulin: Secondary | ICD-10-CM | POA: Diagnosis not present

## 2024-12-01 DIAGNOSIS — R251 Tremor, unspecified: Secondary | ICD-10-CM | POA: Diagnosis not present

## 2024-12-01 DIAGNOSIS — E162 Hypoglycemia, unspecified: Secondary | ICD-10-CM

## 2024-12-01 DIAGNOSIS — Z7984 Long term (current) use of oral hypoglycemic drugs: Secondary | ICD-10-CM | POA: Insufficient documentation

## 2024-12-01 HISTORY — DX: Type 2 diabetes mellitus without complications: E11.9

## 2024-12-01 LAB — URINALYSIS, ROUTINE W REFLEX MICROSCOPIC
Bacteria, UA: NONE SEEN
Bilirubin Urine: NEGATIVE
Glucose, UA: NEGATIVE mg/dL
Ketones, ur: 20 mg/dL — AB
Leukocytes,Ua: NEGATIVE
Nitrite: NEGATIVE
Protein, ur: 30 mg/dL — AB
Specific Gravity, Urine: 1.017 (ref 1.005–1.030)
pH: 6 (ref 5.0–8.0)

## 2024-12-01 LAB — CBC WITH DIFFERENTIAL/PLATELET
Abs Immature Granulocytes: 0.02 K/uL (ref 0.00–0.07)
Basophils Absolute: 0 K/uL (ref 0.0–0.1)
Basophils Relative: 1 %
Eosinophils Absolute: 0 K/uL (ref 0.0–0.5)
Eosinophils Relative: 1 %
HCT: 37.4 % — ABNORMAL LOW (ref 39.0–52.0)
Hemoglobin: 13 g/dL (ref 13.0–17.0)
Immature Granulocytes: 1 %
Lymphocytes Relative: 21 %
Lymphs Abs: 0.9 K/uL (ref 0.7–4.0)
MCH: 33.1 pg (ref 26.0–34.0)
MCHC: 34.8 g/dL (ref 30.0–36.0)
MCV: 95.2 fL (ref 80.0–100.0)
Monocytes Absolute: 0.3 K/uL (ref 0.1–1.0)
Monocytes Relative: 6 %
Neutro Abs: 3.2 K/uL (ref 1.7–7.7)
Neutrophils Relative %: 70 %
Platelets: 152 K/uL (ref 150–400)
RBC: 3.93 MIL/uL — ABNORMAL LOW (ref 4.22–5.81)
RDW: 12.7 % (ref 11.5–15.5)
WBC: 4.4 K/uL (ref 4.0–10.5)
nRBC: 0 % (ref 0.0–0.2)

## 2024-12-01 LAB — COMPREHENSIVE METABOLIC PANEL WITH GFR
ALT: 85 U/L — ABNORMAL HIGH (ref 0–44)
AST: 142 U/L — ABNORMAL HIGH (ref 15–41)
Albumin: 4.9 g/dL (ref 3.5–5.0)
Alkaline Phosphatase: 76 U/L (ref 38–126)
Anion gap: 19 — ABNORMAL HIGH (ref 5–15)
BUN: 10 mg/dL (ref 6–20)
CO2: 24 mmol/L (ref 22–32)
Calcium: 8.9 mg/dL (ref 8.9–10.3)
Chloride: 98 mmol/L (ref 98–111)
Creatinine, Ser: 0.64 mg/dL (ref 0.61–1.24)
GFR, Estimated: >60 mL/min (ref >60)
Glucose, Bld: 93 mg/dL (ref 70–99)
Potassium: 3.4 mmol/L — ABNORMAL LOW (ref 3.5–5.1)
Sodium: 141 mmol/L (ref 135–145)
Total Bilirubin: 0.5 mg/dL (ref 0.0–1.2)
Total Protein: 7.7 g/dL (ref 6.5–8.1)

## 2024-12-01 LAB — CBG MONITORING, ED
Glucose-Capillary: 95 mg/dL (ref 70–99)
Glucose-Capillary: 86 mg/dL (ref 70–99)

## 2024-12-01 NOTE — Telephone Encounter (Signed)
 Copied from CRM #[id number]. Topic: Clinical - Prescription Issue >> Dec 01, 2024  1:01 PM Montie POUR wrote: Reason for CRM:  Mr. Nickson insurance will not approve anything for [employer]. Insurance will pay for Dexcom. Please send order for Dexcom to [facility] in his chart. He does not any monitor his blood sugar please send as soon as possible. Thanks

## 2024-12-01 NOTE — Telephone Encounter (Signed)
 Patient disconnected during transfer to nurse triage line. Attempted to call patient back with no answer.   Copied from CRM #[id number]. Topic: Clinical - Red Word Triage >> Dec 01, 2024  4:59 PM Matthew Moody wrote: Red Word that prompted transfer to Nurse Triage: blood glucose is still dropping and is at 50 now.  Spoke with pt and he ok'd me to speak with wife.   Had issue this morning this glucose being low 54

## 2024-12-01 NOTE — ED Provider Notes (Signed)
 Matthew Moody EMERGENCY DEPARTMENT AT [facility]  Provider Note  CSN: [id number] Arrival date & time: 12/01/24 1739  History Chief Complaint  Patient presents with   Hypoglycemia    Matthew Moody is a 53 y.o. male with history of DM, on metformin  and Lantus  (15Units at bedtime). He was recently no a trial of Libre CGM, but his insurance would only cover DexCom and he has not yet picked that up from pharmacy. Earlier today he was feeling sweaty, heart racing and shaky. His CBG was 50 at that time, he drank some orange juice and began to feel better. Advised by PCP to come to the ED. He has been here several hours at this point without worsening symptoms.    Home Medications Prior to Admission medications   Medication Sig Start Date End Date Taking? Authorizing Provider  amLODipine  (NORVASC ) 5 MG tablet Take 1 tablet (5 mg total) by mouth daily. 04/05/24   Alphonsa Glendia DELENA, MD  Blood Glucose Monitoring Suppl DEVI 1 each by Does not apply route as directed. Dispense based on patient and insurance preference. Use up to four times daily as directed. (FOR ICD-10 E10.9, E11.9). 10/11/24   Gladis, Mary-Margaret, FNP  Continuous Glucose Sensor (FREESTYLE LIBRE 3 PLUS SENSOR) MISC Change sensor every 15 days. 10/19/24   Gladis Mary-Margaret, FNP  glucose blood (FREESTYLE LITE) test strip Test BS up to 4 times daily Dx E11.65 11/29/24   Gladis Mustard, FNP  ibuprofen  (ADVIL ) 600 MG tablet Take 1 tablet (600 mg total) by mouth every 8 (eight) hours as needed. 10/07/24   Towana Ozell BROCKS, MD  insulin glargine  (LANTUS  SOLOSTAR) 100 UNIT/ML Solostar Pen Inject 15 Units into the skin daily. 10/19/24   Gladis Mustard, FNP  Lancet Device MISC 1 each by Does not apply route as directed. Dispense based on patient and insurance preference. Use up to four times daily as directed. (FOR ICD-10 E10.9, E11.9). 10/11/24   Gladis Mustard, FNP  Lancets MISC 1 each by Does not apply route as  directed. Dispense based on patient and insurance preference. Use up to four times daily as directed. (FOR ICD-10 E10.9, E11.9). 10/11/24   Gladis, Mary-Margaret, FNP  meloxicam  (MOBIC ) 15 MG tablet Take 1 tablet (15 mg total) by mouth daily. 10/11/24   Gladis Mary-Margaret, FNP  metFORMIN  (GLUCOPHAGE ) 500 MG tablet Take 1 tablet (500 mg total) by mouth 2 (two) times daily with a meal. 08/24/24   Luking, Glendia DELENA, MD  methocarbamol  (ROBAXIN ) 500 MG tablet Take 1 tablet (500 mg total) by mouth every 8 (eight) hours as needed for muscle spasms. 10/07/24   Butler, Michael C, MD  Multiple Vitamin (MULTIVITAMIN WITH MINERALS) TABS tablet Take 1 tablet by mouth daily.    [provider]  pantoprazole  (PROTONIX ) 40 MG tablet TAKE 1 TABLET BY MOUTH EVERY DAY 10/11/24   Alphonsa Glendia DELENA, MD  rosuvastatin  (CRESTOR ) 10 MG tablet Take 1 tablet (10 mg total) by mouth daily. 05/21/24   Alphonsa Glendia DELENA, MD     Allergies    Patient has no known allergies.   Review of Systems   Review of Systems Please see HPI for pertinent positives and negatives  Physical Exam BP (!) 150/83   Pulse 85   Temp 98.8 F (37.1 C)   Resp 17   Ht 5' 8 (1.727 m)   Wt 83.9 kg   SpO2 98%   BMI 28.13 kg/m   Physical Exam Vitals and nursing note reviewed.  Constitutional:      Appearance: Normal appearance.  HENT:     Head: Normocephalic and atraumatic.     Nose: Nose normal.     Mouth/Throat:     Mouth: Mucous membranes are moist.  Eyes:     Extraocular Movements: Extraocular movements intact.     Conjunctiva/sclera: Conjunctivae normal.  Cardiovascular:     Rate and Rhythm: Normal rate.  Pulmonary:     Effort: Pulmonary effort is normal.     Breath sounds: Normal breath sounds.  Abdominal:     General: Abdomen is flat.     Palpations: Abdomen is soft.     Tenderness: There is no abdominal tenderness.  Musculoskeletal:        General: No swelling. Normal range of motion.     Cervical back: Neck  supple.  Skin:    General: Skin is warm and dry.  Neurological:     General: No focal deficit present.     Mental Status: He is alert.  Psychiatric:        Mood and Affect: Mood normal.     ED Results / Procedures / Treatments   EKG None  Procedures Procedures  Medications Ordered in the ED Medications - No data to display  Initial Impression and Plan  Patient here with transient hypoglycemia, relatively mild symptoms. No AMS. He had labs done in triage with unremarkable CBC, CMP and UA. Recheck CBG at 2200hrs was normal range. He has a reassuring exam. Recommend he hold his evening Lantus  dose tonight, recheck glucose in AM and pick up his Dexcom. Advised to discuss dosage adjustments if he has further episodes of low glucose. PCP follow up, RTED for any other concerns.    ED Course       MDM Rules/Calculators/A&P Medical Decision Making Given presenting complaint, I considered that admission might be necessary. After review of results from ED lab and/or imaging studies, admission to the hospital is not indicated at this time.    Problems Addressed: Hypoglycemia: acute illness or injury  Amount and/or Complexity of Data Reviewed Labs: ordered. Decision-making details documented in ED Course.  Risk Decision regarding hospitalization.     Final Clinical Impression(s) / ED Diagnoses Final diagnoses:  Hypoglycemia    Rx / DC Orders ED Discharge Orders     None        Roselyn Carlin NOVAK, MD 12/01/24 2332

## 2024-12-01 NOTE — ED Notes (Signed)
 Pt ambulatory to triage- CBG 86- NAD noted.

## 2024-12-01 NOTE — Telephone Encounter (Signed)
 FYI Only or Action Required?: Action required by provider: update on patient condition.  Patient was last seen in primary care on 11/08/2024 by Gladis Mustard, FNP.  Called Nurse Triage reporting Blood Sugar Problem.  Triage Disposition: Call PCP Now  Patient/caregiver understands and will follow disposition?: Yes  Reason for Disposition  Call about patient who is currently hospitalized  Answer Assessment - Initial Assessment Questions 1. REASON FOR CALL or QUESTION: What is your reason for calling today? or How can I best     Pt's wife returned call; states that BS did drop to 50 so she took him to ED. Pt currently an [facility].  Protocols used: PCP Call - No Triage-A-AH

## 2024-12-01 NOTE — ED Triage Notes (Signed)
 Pt arrived via POV c/o hypoglycemia that Pt reports noticing this morning. Pt reports drinking orange juice after his glucose reader reported his CBG was 50 at home. Pt presents in Triage and his CBG is 95. Pt endorses feeling shaky and lightheaded.

## 2024-12-02 ENCOUNTER — Other Ambulatory Visit: Payer: Self-pay | Admitting: Nurse Practitioner

## 2024-12-02 MED ORDER — DEXCOM G7 SENSOR MISC: 5 refills | Status: DC

## 2024-12-02 NOTE — Progress Notes (Signed)
 Meds ordered this encounter  Medications   Continuous Glucose Sensor (DEXCOM G7 SENSOR) MISC    Sig: Change every 10 days    Dispense:  3 each    Refill:  5    Supervising Provider:   MARYANNE CHEW A [[id number]]   Mary-Margaret Gladis, FNP

## 2024-12-29 ENCOUNTER — Other Ambulatory Visit: Payer: Self-pay

## 2024-12-29 ENCOUNTER — Emergency Department (HOSPITAL_COMMUNITY)
Admission: EM | Admit: 2024-12-29 | Discharge: 2024-12-29 | Disposition: A | Discharge status: Home / Self Care | Attending: Emergency Medicine | Admitting: Emergency Medicine

## 2024-12-29 ENCOUNTER — Ambulatory Visit: Payer: Self-pay

## 2024-12-29 ENCOUNTER — Encounter (HOSPITAL_COMMUNITY): Payer: Self-pay

## 2024-12-29 DIAGNOSIS — Z79899 Other long term (current) drug therapy: Secondary | ICD-10-CM | POA: Diagnosis not present

## 2024-12-29 DIAGNOSIS — Z7984 Long term (current) use of oral hypoglycemic drugs: Secondary | ICD-10-CM | POA: Diagnosis not present

## 2024-12-29 DIAGNOSIS — E119 Type 2 diabetes mellitus without complications: Secondary | ICD-10-CM | POA: Insufficient documentation

## 2024-12-29 DIAGNOSIS — I10 Essential (primary) hypertension: Secondary | ICD-10-CM | POA: Diagnosis not present

## 2024-12-29 DIAGNOSIS — Z794 Long term (current) use of insulin: Secondary | ICD-10-CM | POA: Insufficient documentation

## 2024-12-29 DIAGNOSIS — R197 Diarrhea, unspecified: Secondary | ICD-10-CM | POA: Diagnosis present

## 2024-12-29 LAB — COMPREHENSIVE METABOLIC PANEL WITH GFR
ALT: 38 U/L (ref 0–44)
AST: 38 U/L (ref 15–41)
Albumin: 4.8 g/dL (ref 3.5–5.0)
Alkaline Phosphatase: 70 U/L (ref 38–126)
Anion gap: 24 — ABNORMAL HIGH (ref 5–15)
BUN: 9 mg/dL (ref 6–20)
CO2: 18 mmol/L — ABNORMAL LOW (ref 22–32)
Calcium: 9 mg/dL (ref 8.9–10.3)
Chloride: 93 mmol/L — ABNORMAL LOW (ref 98–111)
Creatinine, Ser: 0.92 mg/dL (ref 0.61–1.24)
GFR, Estimated: >60 mL/min
Glucose, Bld: 235 mg/dL — ABNORMAL HIGH (ref 70–99)
Potassium: 3.5 mmol/L (ref 3.5–5.1)
Sodium: 134 mmol/L — ABNORMAL LOW (ref 135–145)
Total Bilirubin: 0.7 mg/dL (ref 0.0–1.2)
Total Protein: 7.7 g/dL (ref 6.5–8.1)

## 2024-12-29 LAB — URINALYSIS, ROUTINE W REFLEX MICROSCOPIC
Bacteria, UA: NONE SEEN
Bilirubin Urine: NEGATIVE
Glucose, UA: >=500 mg/dL — AB
Hgb urine dipstick: NEGATIVE
Ketones, ur: 20 mg/dL — AB
Leukocytes,Ua: NEGATIVE
Nitrite: NEGATIVE
Protein, ur: 30 mg/dL — AB
Specific Gravity, Urine: 1.01 (ref 1.005–1.030)
pH: 6 (ref 5.0–8.0)

## 2024-12-29 LAB — BLOOD GAS, VENOUS
Acid-Base Excess: 3.7 mmol/L — ABNORMAL HIGH (ref 0.0–2.0)
Bicarbonate: 28.5 mmol/L — ABNORMAL HIGH (ref 20.0–28.0)
Drawn by: 682731
O2 Saturation: 62.5 %
Patient temperature: 36.4
pCO2, Ven: 42 mmHg — ABNORMAL LOW (ref 44–60)
pH, Ven: 7.44 — ABNORMAL HIGH (ref 7.25–7.43)
pO2, Ven: 35 mmHg (ref 32–45)

## 2024-12-29 LAB — CBC
HCT: 40.6 % (ref 39.0–52.0)
Hemoglobin: 14.5 g/dL (ref 13.0–17.0)
MCH: 33 pg (ref 26.0–34.0)
MCHC: 35.7 g/dL (ref 30.0–36.0)
MCV: 92.3 fL (ref 80.0–100.0)
Platelets: 185 K/uL (ref 150–400)
RBC: 4.4 MIL/uL (ref 4.22–5.81)
RDW: 11.8 % (ref 11.5–15.5)
WBC: 3.6 K/uL — ABNORMAL LOW (ref 4.0–10.5)
nRBC: 0 % (ref 0.0–0.2)

## 2024-12-29 LAB — LIPASE, BLOOD: Lipase: 62 U/L — ABNORMAL HIGH (ref 11–51)

## 2024-12-29 LAB — MAGNESIUM: Magnesium: 1.2 mg/dL — ABNORMAL LOW (ref 1.7–2.4)

## 2024-12-29 LAB — BETA-HYDROXYBUTYRIC ACID: Beta-Hydroxybutyric Acid: 1.99 mmol/L — ABNORMAL HIGH (ref 0.05–0.27)

## 2024-12-29 MED ORDER — MAGNESIUM OXIDE -MG SUPPLEMENT 400 (240 MG) MG PO TABS: 400.0000 mg | ORAL_TABLET | Freq: Every day | ORAL | 0 refills | Status: DC

## 2024-12-29 MED ORDER — POTASSIUM CHLORIDE 20 MEQ PO PACK
20.0000 meq | PACK | Freq: Once | ORAL | Status: AC
  Administered 2024-12-29: 20 meq via ORAL
  Filled 2024-12-29: qty 1

## 2024-12-29 MED ORDER — DICYCLOMINE HCL 10 MG PO CAPS: 10.0000 mg | ORAL_CAPSULE | Freq: Three times a day (TID) | ORAL | 0 refills | Status: DC

## 2024-12-29 MED ORDER — LACTATED RINGERS IV BOLUS
1000.0000 mL | Freq: Once | INTRAVENOUS | Status: AC
  Administered 2024-12-29: 1000 mL via INTRAVENOUS

## 2024-12-29 MED ORDER — ONDANSETRON 4 MG PO TBDP: 4.0000 mg | ORAL_TABLET | Freq: Three times a day (TID) | ORAL | 0 refills | Status: DC | PRN

## 2024-12-29 MED ORDER — MAGNESIUM SULFATE 2 GM/50ML IV SOLN
2.0000 g | Freq: Once | INTRAVENOUS | Status: AC
  Administered 2024-12-29: 2 g via INTRAVENOUS
  Filled 2024-12-29: qty 50

## 2024-12-29 MED ORDER — DICYCLOMINE HCL 10 MG PO CAPS
10.0000 mg | ORAL_CAPSULE | Freq: Once | ORAL | Status: AC
  Administered 2024-12-29: 10 mg via ORAL
  Filled 2024-12-29: qty 1

## 2024-12-29 MED ORDER — KETOROLAC TROMETHAMINE 30 MG/ML IJ SOLN
15.0000 mg | Freq: Once | INTRAMUSCULAR | Status: AC
  Administered 2024-12-29: 15 mg via INTRAVENOUS
  Filled 2024-12-29: qty 1

## 2024-12-29 MED ORDER — MAGNESIUM OXIDE -MG SUPPLEMENT 400 (240 MG) MG PO TABS
400.0000 mg | ORAL_TABLET | Freq: Once | ORAL | Status: AC
  Administered 2024-12-29: 400 mg via ORAL
  Filled 2024-12-29: qty 1

## 2024-12-29 NOTE — ED Provider Notes (Signed)
 Care of patient assumed from Dr. Fredia. Diarrhea x 3 days but improving. Labs and reassessment pending.  Physical Exam  BP (!) 159/100   Pulse 84   Temp 97.6 F (36.4 C) (Oral)   Resp 16   Ht 5' 8 (1.727 m)   Wt 83.9 kg   SpO2 99%   BMI 28.13 kg/m   Physical Exam Vitals and nursing note reviewed.  Constitutional:      General: He is not in acute distress.    Appearance: Normal appearance. He is well-developed. He is not ill-appearing, toxic-appearing or diaphoretic.  HENT:     Head: Normocephalic and atraumatic.     Right Ear: External ear normal.     Left Ear: External ear normal.     Nose: Nose normal.     Mouth/Throat:     Mouth: Mucous membranes are moist.  Eyes:     Extraocular Movements: Extraocular movements intact.     Conjunctiva/sclera: Conjunctivae normal.  Cardiovascular:     Rate and Rhythm: Normal rate and regular rhythm.  Pulmonary:     Effort: Pulmonary effort is normal. No respiratory distress.  Abdominal:     General: There is no distension.     Palpations: Abdomen is soft.  Musculoskeletal:        General: No swelling. Normal range of motion.     Cervical back: Normal range of motion.  Skin:    General: Skin is warm and dry.     Coloration: Skin is not jaundiced or pale.  Neurological:     General: No focal deficit present.     Mental Status: He is alert and oriented to person, place, and time.  Psychiatric:        Mood and Affect: Mood normal.        Behavior: Behavior normal.     Procedures  Procedures  ED Course / MDM   Clinical Course as of 12/29/24 1545  Wed Dec 29, 2024  1502 Lab significant for anion gap metabolic acidosis so we will check VBG and serum ketones.  Also note very low magnesium  so checking EKG and starting repletion. [AJ]    Clinical Course User Index [AJ] Fredia Rosette Kirsch, MD   Medical Decision Making Amount and/or Complexity of Data Reviewed Labs: ordered.  Risk OTC drugs. Prescription drug  management.   On assessment, patient resting comfortably.  Infusions have completed.  He was discharged in stable condition.       Melvenia Motto, MD 12/29/24 [phone]

## 2024-12-29 NOTE — Telephone Encounter (Signed)
 FYI Only or Action Required?: Action required by provider: update on patient condition and going to [facility] ED.  Patient was last seen in primary care on 11/08/2024 by Gladis Mustard, FNP.  Called Nurse Triage reporting Advice Only.  Symptoms began about a month ago.  Interventions attempted: Nothing.  Symptoms are: rapidly worsening.  Triage Disposition: Call PCP Now  Patient/caregiver understands and will follow disposition?: Yes    Copied from CRM #[id number]. Topic: Clinical - Red Word Triage >> Dec 29, 2024 11:20 AM Susanna ORN wrote: Red Word that prompted transfer to Nurse Triage: Patient's spouse, Redell Nazir, calling in wanting to request some testing to be done for her husband. She wants to see if Mary-Margaret Gladis can order those. States patient has been sick since Sunday. He's hardly eating or drinking anything. Had blood in stool & has been complaining about stomach hurting. States his glucose was 250 on yesterday & she was able to get it to the 220 yesterday as the lowest. This morning it was 179. Mrs. Spraker states that he's very weak and she would like for his pancreas and colon to be checked. States she's going to take to him [facility] & hopefully they don't send him home like they always do.     Reason for Disposition  [1] Follow-up call from patient regarding patient's clinical status AND [2] information urgent  Answer Assessment - Initial Assessment Questions 1. REASON FOR CALL or QUESTION: What is your reason for calling today? or How can I best     Pt's wife contacted clinic to update PCP that she is taking pt to [facility] ED to hopefully be admitted. She states that pt has worsened since last ED visit 12/01/24. Pt has become more weak, lack of appetite. She reports she is concerned d/t yellowing of pt's skin and eyes, increased weakness and non-bright red blood in stool. She states that his blood sugar was elevated to 250 yesterday and  after 15 units of Lantus , blood sugar 220. She states blood sugar this morning is 179. Pt's wife very concerned but pt is agreeable to ED disposition at this time so she is taking him. Reassured her I would send update on pt to PCP. She voiced appreciation.  Protocols used: PCP Call - No Triage-A-AH

## 2024-12-29 NOTE — ED Provider Notes (Signed)
 " [place] EMERGENCY DEPARTMENT AT [facility] Provider Note   CSN: [id number] Arrival date & time: 12/29/24  1223     Patient presents with: Diarrhea   Matthew Moody is a 54 y.o. male. With HTN, DM, pancreatitis when he used to drink more heavily presents with 3 days of watery diarrhea. Having 10-15 water bm per day, last bm 2am so he thinks it is finally slowing down. Nausea but no vomiting. Has some abdominal cramping with diarrhea. No recent antibiotics or travel. No sick contacts or suspicious food intake. Sugars have been running high, 250 yesterday.     Prior to Admission medications  Medication Sig Start Date End Date Taking? Authorizing Provider  dicyclomine  (BENTYL ) 10 MG capsule Take 1 capsule (10 mg total) by mouth 4 (four) times daily -  before meals and at bedtime. 12/29/24  Yes Ragen Laver Hima, MD  amLODipine  (NORVASC ) 5 MG tablet Take 1 tablet (5 mg total) by mouth daily. 04/05/24   Alphonsa Glendia DELENA, MD  Blood Glucose Monitoring Suppl DEVI 1 each by Does not apply route as directed. Dispense based on patient and insurance preference. Use up to four times daily as directed. (FOR ICD-10 E10.9, E11.9). 10/11/24   Gladis Mustard, FNP  Continuous Glucose Sensor (DEXCOM G7 SENSOR) MISC Change every 10 days 12/02/24   Gladis Mustard, FNP  glucose blood (FREESTYLE LITE) test strip Test BS up to 4 times daily Dx E11.65 11/29/24   Gladis Mustard, FNP  ibuprofen  (ADVIL ) 600 MG tablet Take 1 tablet (600 mg total) by mouth every 8 (eight) hours as needed. 10/07/24   Towana Ozell BROCKS, MD  insulin glargine  (LANTUS  SOLOSTAR) 100 UNIT/ML Solostar Pen Inject 15 Units into the skin daily. 10/19/24   Gladis Mustard, FNP  Lancet Device MISC 1 each by Does not apply route as directed. Dispense based on patient and insurance preference. Use up to four times daily as directed. (FOR ICD-10 E10.9, E11.9). 10/11/24   Gladis Mustard, FNP  Lancets MISC 1 each  by Does not apply route as directed. Dispense based on patient and insurance preference. Use up to four times daily as directed. (FOR ICD-10 E10.9, E11.9). 10/11/24   Gladis Mary-Margaret, FNP  meloxicam  (MOBIC ) 15 MG tablet Take 1 tablet (15 mg total) by mouth daily. 10/11/24   Gladis Mary-Margaret, FNP  metFORMIN  (GLUCOPHAGE ) 500 MG tablet Take 1 tablet (500 mg total) by mouth 2 (two) times daily with a meal. 08/24/24   Luking, Glendia DELENA, MD  methocarbamol  (ROBAXIN ) 500 MG tablet Take 1 tablet (500 mg total) by mouth every 8 (eight) hours as needed for muscle spasms. 10/07/24   Butler, Michael C, MD  Multiple Vitamin (MULTIVITAMIN WITH MINERALS) TABS tablet Take 1 tablet by mouth daily.    [provider]  pantoprazole  (PROTONIX ) 40 MG tablet TAKE 1 TABLET BY MOUTH EVERY DAY 10/11/24   Alphonsa Glendia DELENA, MD  rosuvastatin  (CRESTOR ) 10 MG tablet Take 1 tablet (10 mg total) by mouth daily. 05/21/24   Alphonsa Glendia DELENA, MD    Allergies: Patient has no known allergies.      Updated Vital Signs BP (!) 159/100   Pulse 84   Temp 97.6 F (36.4 C) (Oral)   Resp 16   Ht 5' 8 (1.727 m)   Wt 83.9 kg   SpO2 99%   BMI 28.13 kg/m   Physical Exam   Constitutional: Patient appears well-developed and well-nourished. No distress.  HENT:  Head: Normocephalic and atraumatic.  Right Ear: External ear normal.  Left Ear: External ear normal.  Mouth/Throat: Oropharynx is clear and moist. No oropharyngeal exudate.  Eyes: Conjunctivae are normal. Pupils are equal, round, and reactive to light. Neck: Normal range of motion. Neck supple.  Cardiovascular: Normal rate, regular rhythm, normal heart sounds and intact distal pulses.   Pulmonary/Chest: Effort normal and breath sounds normal. No respiratory distress. No wheezes or rales.  Abdominal: Soft. Bowel sounds are normal. No distension or tenderness.  Musculoskeletal: Normal range of motion. No edema or tenderness.  Neurological: Patient is alert  with normal muscle tone. Coordination normal.  Skin: Skin is warm and dry. No diaphoresis.  Psychiatric: Normal mood and affect. Normal behavior. Judgment and thought content normal.  Nursing note and vitals reviewed.   (all labs ordered are listed, but only abnormal results are displayed) Labs Reviewed  LIPASE, BLOOD - Abnormal; Notable for the following components:      Result Value   Lipase 62 (*)    All other components within normal limits  COMPREHENSIVE METABOLIC PANEL WITH GFR - Abnormal; Notable for the following components:   Sodium 134 (*)    Chloride 93 (*)    CO2 18 (*)    Glucose, Bld 235 (*)    Anion gap 24 (*)    All other components within normal limits  CBC - Abnormal; Notable for the following components:   WBC 3.6 (*)    All other components within normal limits  URINALYSIS, ROUTINE W REFLEX MICROSCOPIC - Abnormal; Notable for the following components:   Glucose, UA >=500 (*)    Ketones, ur 20 (*)    Protein, ur 30 (*)    All other components within normal limits  MAGNESIUM  - Abnormal; Notable for the following components:   Magnesium  1.2 (*)    All other components within normal limits  BLOOD GAS, VENOUS - Abnormal; Notable for the following components:   pH, Ven 7.44 (*)    pCO2, Ven 42 (*)    Bicarbonate 28.5 (*)    Acid-Base Excess 3.7 (*)    All other components within normal limits  GASTROINTESTINAL PANEL BY PCR, STOOL (REPLACES STOOL CULTURE)  BETA-HYDROXYBUTYRIC ACID    EKG: Sinus rhythm at 80 bpm, normal axis, normal intervals, no acute ischemia.  Radiology: No results found.  This patient presents to the ED with chief complaint(s) of diarrhea with pertinent past medical history of diabetes, pancreatitis . The complaint involves an extensive differential diagnosis and also carries with it a high risk of complications and morbidity.    Additional history obtained from . I have also reviewed Primary Care Documents  The differential diagnosis  includes gastroenteritis, diverticulitis, DKA, dehydration, AKI.  The initial management included fluids, Bentyl , Toradol     Reassessments:  Patient feeling much better after fluids.  Note low magnesium  but EKG is showing normal conduction.  Awaiting VBG due to elevated anion gap.  Plan for discharge if pH is reassuring and patient is feeling better.  Signed out to oncoming attending at 330.  Clinical Course as of 12/29/24 1620  Wed Dec 29, 2024  1502 Lab significant for anion gap metabolic acidosis so we will check VBG and serum ketones.  Also note very low magnesium  so checking EKG and starting repletion. [AJ]    Clinical Course User Index [AJ] Airyonna Franklyn, Rosette Kirsch, MD     Consultation: - Consulted or discussed management/test interpretation with external professional: None  Consideration for admission or further workup: Pending VBG and  reassessment  Social Determinants of health: None  Final diagnoses:  Diarrhea, unspecified type    ED Discharge Orders          Ordered    dicyclomine  (BENTYL ) 10 MG capsule  3 times daily before meals & bedtime        12/29/24 1619               Fredia Rosette Kirsch, MD 12/29/24 1621  "

## 2024-12-29 NOTE — ED Triage Notes (Signed)
 Pt arrived via POV c/o generalized abdominal pain and diarrhea since Sunday. Pt endorses feeling nauseated as well and recently having elevated blood glucose yesterday.

## 2024-12-29 NOTE — Discharge Instructions (Addendum)
 In the emergency department for diarrhea.  Your magnesium  level was very low at 1.2 and we gave you some magnesium  here.  I am also prescribing magnesium  supplement as well as a medicine for your cramping.  Please have your doctor recheck your blood work in the next week.  Continue sipping non-caffeinated clear fluids, and eat bland foods in small amounts if you are hungry.  If you develop fevers, abdominal pain, vomiting and unable to hold down fluids, blood in stool, dizziness, or any other concerns please proceed to the ER.  Otherwise see your doctor in a few days for a recheck.  Thank you for allowing me to take care of you today and I hope you feel better soon!

## 2024-12-29 NOTE — Telephone Encounter (Signed)
 Pt is at [facility] ED

## 2025-01-11 ENCOUNTER — Ambulatory Visit: Admitting: Nurse Practitioner

## 2025-01-12 ENCOUNTER — Other Ambulatory Visit: Payer: Self-pay | Admitting: Nurse Practitioner

## 2025-01-12 DIAGNOSIS — M545 Low back pain, unspecified: Secondary | ICD-10-CM

## 2025-01-13 ENCOUNTER — Encounter: Payer: Self-pay | Admitting: Nurse Practitioner

## 2025-01-13 ENCOUNTER — Ambulatory Visit: Admitting: Nurse Practitioner

## 2025-01-13 VITALS — BP 142/82 | HR 83 | Temp 98.1°F | Ht 68.0 in | Wt 186.0 lb

## 2025-01-13 DIAGNOSIS — F1020 Alcohol dependence, uncomplicated: Secondary | ICD-10-CM | POA: Diagnosis not present

## 2025-01-13 DIAGNOSIS — E1169 Type 2 diabetes mellitus with other specified complication: Secondary | ICD-10-CM

## 2025-01-13 DIAGNOSIS — I10 Essential (primary) hypertension: Secondary | ICD-10-CM | POA: Diagnosis not present

## 2025-01-13 DIAGNOSIS — Z794 Long term (current) use of insulin: Secondary | ICD-10-CM

## 2025-01-13 DIAGNOSIS — Z8719 Personal history of other diseases of the digestive system: Secondary | ICD-10-CM | POA: Diagnosis not present

## 2025-01-13 DIAGNOSIS — E66811 Obesity, class 1: Secondary | ICD-10-CM | POA: Diagnosis not present

## 2025-01-13 DIAGNOSIS — Z125 Encounter for screening for malignant neoplasm of prostate: Secondary | ICD-10-CM

## 2025-01-13 DIAGNOSIS — Z6831 Body mass index (BMI) 31.0-31.9, adult: Secondary | ICD-10-CM | POA: Diagnosis not present

## 2025-01-13 DIAGNOSIS — E6609 Other obesity due to excess calories: Secondary | ICD-10-CM

## 2025-01-13 LAB — LIPID PANEL

## 2025-01-13 LAB — BAYER DCA HB A1C WAIVED: HB A1C (BAYER DCA - WAIVED): 6.9 % — ABNORMAL HIGH (ref 4.8–5.6)

## 2025-01-13 MED ORDER — LANTUS SOLOSTAR 100 UNIT/ML ~~LOC~~ SOPN: 12.0000 [IU] | PEN_INJECTOR | Freq: Every day | SUBCUTANEOUS | 99 refills | Status: AC

## 2025-01-13 MED ORDER — AMLODIPINE BESYLATE 10 MG PO TABS: 10.0000 mg | ORAL_TABLET | Freq: Every day | ORAL | 1 refills | Status: AC

## 2025-01-13 NOTE — Patient Instructions (Signed)
Alcohol Misuse and Dependence Information, Adult Alcohol is a widely available drug and people choose to drink alcohol in different amounts. Alcohol misuse and dependence can have a negative effect on your life. Alcohol misuse is when you use alcohol too much or too often. You may have a hard time setting a limit on the amount you drink. Alcohol dependence is when you use alcohol consistently for a period of time, and your body changes as a result. Alcohol dependence can make it hard for you to stop drinking because you may start to feel sick or different when you do not drink alcohol. These symptoms are known as withdrawal. People who drink alcohol very often and in large amounts, may develop what is called an alcohol use disorder. How can alcohol misuse and dependence affect me? Drinking too much can lead to addiction. You may feel like you need alcohol to function normally. You may drink alcohol before work in the morning, during the day, or as soon as you get home from work in the evening. These actions can result in: Poor work performance. Job loss. Financial problems. Car crashes or criminal charges from driving after drinking alcohol. Problems in your relationships with friends and family. Losing the trust and respect of coworkers, friends, and family. Drinking heavily over a long period of time can permanently damage your body and brain, and can cause lifelong health issues, such as: Damage to your liver or pancreas. Heart problems, high blood pressure, or stroke. Certain cancers. Decreased ability to fight infections. Brain or nerve damage. Depression. Early death, also called premature death. If you are careless or you crave alcohol, it is easy to drink more than your body can handle (overdose). Alcohol overdose is a serious situation that requires hospitalization. It may lead to permanent injuries or death. What can increase my risk? Having a family history of alcohol misuse. Having  depression or other mental health conditions. Beginning to drink at an early age. Binge drinking often. Experiencing trauma, stress, and an unstable home life during childhood. Spending time with people who drink often. What actions can I take to prevent alcohol misuse and dependence? Do not drink alcohol if: Your health care provider tells you not to drink. You are pregnant, may be pregnant, or are planning to become pregnant. If you drink alcohol: Limit how much you have to: 0-1 drink a day for women who are not pregnant. 0-2 drinks a day for men. Know how much alcohol is in your drink. In the [place] one drink equals one 12 oz bottle of beer (355 mL), one 5 oz glass of wine (148 mL), or one 1 oz glass of hard liquor (44 mL). If you think you have an alcohol dependency problem, decide to stop drinking. This can be very hard to do if you are used to frequently drinking alcohol. If you begin to have withdrawal symptoms, talk with your health care provider or a person that you trust. These symptoms may include anxiety, shaky hands, headache, nausea, sweating, or not being able to sleep. Choose to drink nonalcoholic beverages in social gatherings and places where there may be alcohol. Activity Spend more time on activities that you enjoy that do not involve alcohol, like hobbies or exercise. Find healthy ways to cope with stress, such as meditation or spending time with people you care about. General information Talk to your family, coworkers, and friends about supporting you in your efforts to stop drinking. If they drink, ask them not to drink  around you. Spend more time with people who do not drink alcohol. If you think that you have an alcohol dependency problem: Tell friends or family about your concerns. Talk with your health care provider or another health professional about where to get help. Work with a Paramedic and a Network engineer. Consider joining a support group  for people who struggle with alcohol misuse and dependence. Where to find support  Your health care provider. SMART Recovery: smartrecovery.org Local treatment centers or chemical dependency counselors. Local AA groups in your community: CustomizedRugs.fi Where to find more information Centers for Disease Control and Prevention: TonerPromos.no [employer] on Alcohol Abuse and Alcoholism: BackupSupply.hu Alcoholics Anonymous (AA): CustomizedRugs.fi Contact a health care provider if: You drank more or for longer than you intended on more than one occasion. You often drink to the point of vomiting or passing out. You have problems in your life due to drinking, but you continue to drink. You keep drinking even though you feel anxious, depressed, or have experienced memory loss. You have stopped doing the things you used to enjoy in order to drink. You have to drink more than you used to in order to get the effect you want. You experience anxiety, sweating, nausea, shakiness, and trouble sleeping when you try to stop drinking. Get help right away if: You have serious withdrawal symptoms, including: Confusion. Racing heart. High blood pressure. Fever. These symptoms may be an emergency. Get help right away. Call 911. Do not wait to see if the symptoms will go away. Do not drive yourself to the hospital. Also, get help right away if: You have thoughts about hurting yourself or others. Take one of these steps if you feel like you may hurt yourself or others, or have thoughts about taking your own life: Call 911. Call the National Suicide Prevention Lifeline at 703-677-7233 or 988. This is open 24 hours a day. Text the Crisis Text Line at (563)032-9389. Summary Alcohol misuse and dependence can have a negative effect on your life. Drinking too much or too often can lead to addiction. If you drink alcohol, limit how much you use. If you are having trouble keeping your drinking under control, find ways to change your  behavior. Hobbies, calming activities, exercise, or support groups can help. If you feel you need help with changing your drinking habits, talk with your health care provider, a good friend, or a therapist, or go to a support group. This information is not intended to replace advice given to you by your health care provider. Make sure you discuss any questions you have with your health care provider. Document Revised: 02/13/2022 Document Reviewed: 02/13/2022 Elsevier Patient Education  2024 [employer].

## 2025-01-13 NOTE — Progress Notes (Signed)
 "  Subjective:    Patient ID: Matthew Moody, male    DOB: 1971/11/03, 54 y.o.   MRN: [id number]   Chief Complaint: medical management of chronic issues     HPI:  Matthew Moody is a 54 y.o. who identifies as a male who was assigned male at birth.   Social history: Lives with: wife Work history: food service   Comes in today for follow up of the following chronic medical issues:  1. Essential hypertension -  no c/o chest pain, sob or headache. - Does not check blood pressure at  home - on norvasc  daily BP Readings from Last 3 Encounters:  12/29/24 (!) 142/102  12/01/24 (!) 162/89  11/08/24 (!) 159/90     2. Type 2 diabetes mellitus with other specified complication, without long-term current use of insulin (HCC) - Blood sugars have been running around 125-165. - has had some low readings in the 50 and went to ED - Currently  on lantus  15u daily Lab Results  Component Value Date   HGBA1C 8.2 (H) 11/08/2024     3. Uncomplicated alcohol dependence (HCC) occasionally  4. History of pancreatitis - Denies any recent abdomina pain Lab Results  Component Value Date   ALT 38 12/29/2024   AST 38 12/29/2024   ALKPHOS 70 12/29/2024   BILITOT 0.7 12/29/2024     5. Class 1 obesity due to excess calories without serious comorbidity with body mass index (BMI) of 31.0 to 31.9 in adult Wt Readings from Last 3 Encounters:  01/13/25 186 lb (84.4 kg)  12/29/24 185 lb (83.9 kg)  12/01/24 185 lb (83.9 kg)   BMI Readings from Last 3 Encounters:  01/13/25 28.28 kg/m  12/29/24 28.13 kg/m  12/01/24 28.13 kg/m      New complaints: None today  Allergies[1] Outpatient Encounter Medications as of 01/13/2025  Medication Sig   amLODipine  (NORVASC ) 5 MG tablet Take 1 tablet (5 mg total) by mouth daily.   Blood Glucose Monitoring Suppl DEVI 1 each by Does not apply route as directed. Dispense based on patient and insurance preference. Use up to four times daily as directed.  (FOR ICD-10 E10.9, E11.9).   Continuous Glucose Sensor (DEXCOM G7 SENSOR) MISC Change every 10 days   dicyclomine  (BENTYL ) 10 MG capsule Take 1 capsule (10 mg total) by mouth 4 (four) times daily -  before meals and at bedtime.   glucose blood (FREESTYLE LITE) test strip Test BS up to 4 times daily Dx E11.65   ibuprofen  (ADVIL ) 600 MG tablet Take 1 tablet (600 mg total) by mouth every 8 (eight) hours as needed.   insulin glargine  (LANTUS  SOLOSTAR) 100 UNIT/ML Solostar Pen Inject 15 Units into the skin daily.   Lancet Device MISC 1 each by Does not apply route as directed. Dispense based on patient and insurance preference. Use up to four times daily as directed. (FOR ICD-10 E10.9, E11.9).   Lancets MISC 1 each by Does not apply route as directed. Dispense based on patient and insurance preference. Use up to four times daily as directed. (FOR ICD-10 E10.9, E11.9).   magnesium  oxide (MAG-OX) 400 (240 Mg) MG tablet Take 1 tablet (400 mg total) by mouth daily.   meloxicam  (MOBIC ) 15 MG tablet TAKE 1 TABLET (15 MG TOTAL) BY MOUTH DAILY.   metFORMIN  (GLUCOPHAGE ) 500 MG tablet Take 1 tablet (500 mg total) by mouth 2 (two) times daily with a meal.   methocarbamol  (ROBAXIN ) 500 MG tablet Take 1 tablet (  500 mg total) by mouth every 8 (eight) hours as needed for muscle spasms.   Multiple Vitamin (MULTIVITAMIN WITH MINERALS) TABS tablet Take 1 tablet by mouth daily.   ondansetron  (ZOFRAN -ODT) 4 MG disintegrating tablet Take 1 tablet (4 mg total) by mouth every 8 (eight) hours as needed.   pantoprazole  (PROTONIX ) 40 MG tablet TAKE 1 TABLET BY MOUTH EVERY DAY   rosuvastatin  (CRESTOR ) 10 MG tablet Take 1 tablet (10 mg total) by mouth daily.   No facility-administered encounter medications on file as of 01/13/2025.    No past surgical history on file.  Family History  Problem Relation Age of Onset   Hypertension Mother        Review of Systems  Constitutional:  Negative for diaphoresis.  Eyes:   Negative for pain.  Respiratory:  Negative for shortness of breath.   Cardiovascular:  Negative for chest pain, palpitations and leg swelling.  Gastrointestinal:  Negative for abdominal pain.  Endocrine: Negative for polydipsia.  Skin:  Negative for rash.  Neurological:  Negative for dizziness, weakness and headaches.  Hematological:  Does not bruise/bleed easily.  All other systems reviewed and are negative.      Objective:   Physical Exam Vitals and nursing note reviewed.  Constitutional:      Appearance: Normal appearance. He is well-developed.  HENT:     Head: Normocephalic.     Nose: Nose normal.     Mouth/Throat:     Mouth: Mucous membranes are moist.     Pharynx: Oropharynx is clear.  Eyes:     Pupils: Pupils are equal, round, and reactive to light.  Neck:     Thyroid : No thyroid  mass or thyromegaly.     Vascular: No carotid bruit or JVD.     Trachea: Phonation normal.  Cardiovascular:     Rate and Rhythm: Normal rate and regular rhythm.  Pulmonary:     Effort: Pulmonary effort is normal. No respiratory distress.     Breath sounds: Normal breath sounds.  Abdominal:     General: Bowel sounds are normal.     Palpations: Abdomen is soft.     Tenderness: There is no abdominal tenderness.  Musculoskeletal:        General: Normal range of motion.     Cervical back: Normal range of motion and neck supple.  Lymphadenopathy:     Cervical: No cervical adenopathy.  Skin:    General: Skin is warm and dry.  Neurological:     Mental Status: He is alert and oriented to person, place, and time.  Psychiatric:        Behavior: Behavior normal.        Thought Content: Thought content normal.        Judgment: Judgment normal.    BP (!) 142/82   Pulse 83   Temp 98.1 F (36.7 C) (Temporal)   Ht 5' 8 (1.727 m)   Wt 186 lb (84.4 kg)   SpO2 99%   BMI 28.28 kg/m          Assessment & Plan:   Matthew Moody comes in today with chief complaint of Medical Management of  Chronic Issues   Diagnosis and orders addressed:  1. Essential hypertension (Primary) Dash diet Continue amlodipine  - CBC with Differential/Platelet - CMP14+EGFR  2. Type 2 diabetes mellitus with other specified complication, without long-term current use of insulin (HCC) Decrease lantus  to 12u daily Continue to keep frequent checks of blood sugars - Bayer DCA Hb  A1c Waived - Lipid panel - Microalbumin / creatinine urine ratio - insulin glargine  (LANTUS  SOLOSTAR) 100 UNIT/ML Solostar Pen; Inject 12 Units into the skin daily.  Dispense: 6 mL; Refill: PRN  3. Uncomplicated alcohol dependence (HCC) Discusssed alcohol consumption and how it affects blood sugars and physical health  4. History of pancreatitis Report any abdominal pain  5. Class 1 obesity due to excess calories without serious comorbidity with body mass index (BMI) of 31.0 to 31.9 in adult Discussed diet and exercise for person with BMI >25 Will recheck weight in 3-6 months   6. Screening PSA (prostate specific antigen) Labs pending - PSA, total and free   Labs pending Health Maintenance reviewed Diet and exercise encouraged  Follow up plan: 3 months   Mary-Margaret Gladis, FNP     [1] No Known Allergies  "

## 2025-01-14 ENCOUNTER — Ambulatory Visit: Payer: Self-pay | Admitting: Nurse Practitioner

## 2025-01-14 LAB — PSA, TOTAL AND FREE
PSA, Free Pct: 10 %
PSA, Free: 0.24 ng/mL
Prostate Specific Ag, Serum: 2.4 ng/mL (ref 0.0–4.0)

## 2025-01-14 LAB — CBC WITH DIFFERENTIAL/PLATELET
Basophils Absolute: 0 x10E3/uL (ref 0.0–0.2)
Basos: 1 %
EOS (ABSOLUTE): 0 x10E3/uL (ref 0.0–0.4)
Eos: 1 %
Hematocrit: 38.7 % (ref 37.5–51.0)
Hemoglobin: 13.6 g/dL (ref 13.0–17.7)
Immature Grans (Abs): 0 x10E3/uL (ref 0.0–0.1)
Immature Granulocytes: 0 %
Lymphocytes Absolute: 1 x10E3/uL (ref 0.7–3.1)
Lymphs: 20 %
MCH: 33 pg (ref 26.6–33.0)
MCHC: 35.1 g/dL (ref 31.5–35.7)
MCV: 94 fL (ref 79–97)
Monocytes Absolute: 0.3 x10E3/uL (ref 0.1–0.9)
Monocytes: 7 %
Neutrophils Absolute: 3.4 x10E3/uL (ref 1.4–7.0)
Neutrophils: 71 %
Platelets: 258 x10E3/uL (ref 150–450)
RBC: 4.12 x10E6/uL — ABNORMAL LOW (ref 4.14–5.80)
RDW: 12.7 % (ref 11.6–15.4)
WBC: 4.8 x10E3/uL (ref 3.4–10.8)

## 2025-01-14 LAB — MICROALBUMIN / CREATININE URINE RATIO
Creatinine, Urine: 455.6 mg/dL
Microalb/Creat Ratio: 14 mg/g{creat} (ref 0–29)
Microalbumin, Urine: 63 ug/mL

## 2025-01-14 LAB — CMP14+EGFR
ALT: 48 IU/L — AB (ref 0–44)
AST: 78 IU/L — AB (ref 0–40)
Albumin: 4.8 g/dL (ref 3.8–4.9)
Alkaline Phosphatase: 83 IU/L (ref 47–123)
BUN/Creatinine Ratio: 13 (ref 9–20)
BUN: 9 mg/dL (ref 6–24)
Bilirubin Total: 0.6 mg/dL (ref 0.0–1.2)
CO2: 21 mmol/L (ref 20–29)
Calcium: 9.2 mg/dL (ref 8.7–10.2)
Chloride: 98 mmol/L (ref 96–106)
Creatinine, Ser: 0.71 mg/dL — AB (ref 0.76–1.27)
Globulin, Total: 2.4 g/dL (ref 1.5–4.5)
Glucose: 147 mg/dL — AB (ref 70–99)
Potassium: 3.9 mmol/L (ref 3.5–5.2)
Sodium: 138 mmol/L (ref 134–144)
Total Protein: 7.2 g/dL (ref 6.0–8.5)
eGFR: 111 mL/min/1.73

## 2025-01-14 LAB — LIPID PANEL
Cholesterol, Total: 149 mg/dL (ref 100–199)
HDL: 85 mg/dL
LDL CALC COMMENT:: 1.8 ratio (ref 0.0–5.0)
LDL Chol Calc (NIH): 50 mg/dL (ref 0–99)
Triglycerides: 72 mg/dL (ref 0–149)
VLDL Cholesterol Cal: 14 mg/dL (ref 5–40)

## 2025-01-18 ENCOUNTER — Telehealth: Payer: Self-pay | Admitting: Nurse Practitioner

## 2025-01-18 DIAGNOSIS — Z0279 Encounter for issue of other medical certificate: Secondary | ICD-10-CM

## 2025-01-18 NOTE — Telephone Encounter (Signed)
 [employer] faxed STD forms to be completed  Form Fee Paid? (Y/N)    NO / TRIED TO CALL PT / NO ANSWER AND NO VM        If NO, form is placed on front office manager desk to hold until payment received. If YES, then form will be placed in the RX/HH Nurse Coordinators box for completion.  Form will not be processed until payment is received

## 2025-01-18 NOTE — Telephone Encounter (Signed)
 Patient is returning call. I informed him that clinic is currently closed. He would like a callback to let him know how he can make this payment.

## 2025-01-19 NOTE — Telephone Encounter (Signed)
 [employer] faxed Short Term Disability forms to be completed and signed.  Form Fee Paid? (Y/N)       y     If NO, form is placed on front office manager desk to hold until payment received. If YES, then form will be placed in the RX/HH Nurse Coordinators box for completion.  Form will not be processed until payment is received

## 2025-01-20 ENCOUNTER — Telehealth: Payer: Self-pay | Admitting: Nurse Practitioner

## 2025-01-20 DIAGNOSIS — Z0279 Encounter for issue of other medical certificate: Secondary | ICD-10-CM

## 2025-01-20 NOTE — Telephone Encounter (Signed)
 [facility]) dropped off Disability forms to be completed and signed.  Form Fee Paid? (Y/N)      yes      If NO, form is placed on front office manager desk to hold until payment received. If YES, then form will be placed in the RX/HH Nurse Coordinators box for completion.  Form will not be processed until payment is received   Please fax & call wife, when pt's copy is ready!

## 2025-01-20 NOTE — Telephone Encounter (Signed)
 Spoke with wife and she is aware and has already picked up letter.

## 2025-01-20 NOTE — Telephone Encounter (Signed)
 Pt needs an extension on work note from (Jan 28 to Feb 2) bc his sugars have been going up & down. Wife is dropping off Disability forms right now.

## 2025-01-22 ENCOUNTER — Other Ambulatory Visit: Payer: Self-pay | Admitting: Family Medicine

## 2025-01-27 ENCOUNTER — Other Ambulatory Visit: Payer: Self-pay

## 2025-01-27 NOTE — Telephone Encounter (Signed)
 PCP completed and signed disability forms. They have been faxed to [facility] at fax number [phone]. Patient has been contacted and informed they are complete.   Pt asking to have work note to RTW on Monday. Please advise

## 2025-01-28 NOTE — Telephone Encounter (Signed)
 Ok for work note?

## 2025-01-28 NOTE — Telephone Encounter (Signed)
 Patient aware and verbalized understanding.

## 2025-04-07 ENCOUNTER — Ambulatory Visit: Admitting: Nurse Practitioner
